# Patient Record
Sex: Female | Born: 1945 | Race: Asian | Hispanic: No | Marital: Married | Smoking: Never smoker
Health system: Southern US, Community
[De-identification: ages and names within clinical notes are randomized; demographics above are authoritative.]

## PROBLEM LIST (undated history)

## (undated) DIAGNOSIS — E7800 Pure hypercholesterolemia, unspecified: Secondary | ICD-10-CM

## (undated) DIAGNOSIS — I10 Essential (primary) hypertension: Secondary | ICD-10-CM

## (undated) DIAGNOSIS — Z8619 Personal history of other infectious and parasitic diseases: Secondary | ICD-10-CM

## (undated) DIAGNOSIS — R634 Abnormal weight loss: Secondary | ICD-10-CM

## (undated) DIAGNOSIS — E119 Type 2 diabetes mellitus without complications: Secondary | ICD-10-CM

## (undated) DIAGNOSIS — I700 Atherosclerosis of aorta: Secondary | ICD-10-CM

## (undated) DIAGNOSIS — M1990 Unspecified osteoarthritis, unspecified site: Secondary | ICD-10-CM

## (undated) HISTORY — DX: Personal history of other infectious and parasitic diseases: Z86.19

## (undated) HISTORY — DX: Abnormal weight loss: R63.4

## (undated) HISTORY — DX: Atherosclerosis of aorta: I70.0

## (undated) HISTORY — DX: Unspecified osteoarthritis, unspecified site: M19.90

## (undated) HISTORY — DX: Type 2 diabetes mellitus without complications: E11.9

## (undated) HISTORY — PX: NO PAST SURGERIES: SHX2092

## (undated) HISTORY — DX: Essential (primary) hypertension: I10

## (undated) HISTORY — DX: Pure hypercholesterolemia, unspecified: E78.00

---

## 2017-11-16 ENCOUNTER — Encounter: Payer: Self-pay | Admitting: Family Medicine

## 2017-11-16 ENCOUNTER — Ambulatory Visit: Payer: Medicare (Managed Care) | Admitting: Family Medicine

## 2017-11-16 ENCOUNTER — Telehealth: Payer: Self-pay | Admitting: Family Medicine

## 2017-11-16 ENCOUNTER — Ambulatory Visit (HOSPITAL_BASED_OUTPATIENT_CLINIC_OR_DEPARTMENT_OTHER)
Admission: RE | Admit: 2017-11-16 | Discharge: 2017-11-16 | Disposition: A | Discharge status: Home / Self Care | Payer: Medicare (Managed Care) | Source: Ambulatory Visit | Attending: Family Medicine | Admitting: Family Medicine

## 2017-11-16 VITALS — BP 110/70 | HR 95 | Temp 98.4°F | Ht 62.0 in | Wt 112.0 lb

## 2017-11-16 DIAGNOSIS — Z0000 Encounter for general adult medical examination without abnormal findings: Secondary | ICD-10-CM | POA: Diagnosis not present

## 2017-11-16 DIAGNOSIS — M533 Sacrococcygeal disorders, not elsewhere classified: Secondary | ICD-10-CM | POA: Insufficient documentation

## 2017-11-16 DIAGNOSIS — R1900 Intra-abdominal and pelvic swelling, mass and lump, unspecified site: Secondary | ICD-10-CM

## 2017-11-16 DIAGNOSIS — I10 Essential (primary) hypertension: Secondary | ICD-10-CM | POA: Diagnosis not present

## 2017-11-16 DIAGNOSIS — E1165 Type 2 diabetes mellitus with hyperglycemia: Secondary | ICD-10-CM | POA: Insufficient documentation

## 2017-11-16 DIAGNOSIS — E118 Type 2 diabetes mellitus with unspecified complications: Secondary | ICD-10-CM | POA: Diagnosis not present

## 2017-11-16 DIAGNOSIS — Z1159 Encounter for screening for other viral diseases: Secondary | ICD-10-CM

## 2017-11-16 DIAGNOSIS — E2839 Other primary ovarian failure: Secondary | ICD-10-CM

## 2017-11-16 DIAGNOSIS — Z23 Encounter for immunization: Secondary | ICD-10-CM | POA: Diagnosis not present

## 2017-11-16 MED ORDER — ATORVASTATIN CALCIUM 10 MG PO TABS: 10.0000 mg | ORAL_TABLET | Freq: Every day | ORAL | 1 refills | Status: DC

## 2017-11-16 MED ORDER — LOSARTAN POTASSIUM 100 MG PO TABS
100.0000 mg | ORAL_TABLET | Freq: Every day | ORAL | 1 refills | Status: DC
Start: 2017-11-16 — End: 2018-06-02

## 2017-11-16 MED ORDER — CHLORTHALIDONE 25 MG PO TABS: 25.0000 mg | ORAL_TABLET | Freq: Every day | ORAL | 1 refills | Status: DC

## 2017-11-16 MED ORDER — METFORMIN HCL 1000 MG PO TABS: 1000.0000 mg | ORAL_TABLET | Freq: Two times a day (BID) | ORAL | 3 refills | Status: DC

## 2017-11-16 MED FILL — LOSARTAN POTASSIUM 100 MG T: 100 | 90 days supply | Qty: 90 | Fill #0

## 2017-11-16 MED FILL — ATORVASTATIN 10 MG TABLET: 10 | 90 days supply | Qty: 90 | Fill #0

## 2017-11-16 MED FILL — CHLORTHALIDONE 25 MG TABS: 25 | 90 days supply | Qty: 90 | Fill #0

## 2017-11-16 MED FILL — metFORMIN HCL 1000 MG TABS: 1000 | 90 days supply | Qty: 180 | Fill #0

## 2017-11-16 NOTE — Progress Notes (Signed)
Chief Complaint  Patient presents with  . New Patient (Initial Visit)    Well Female Dawn Schwartz is here for a complete physical.   Her last physical was >1 year ago.  Current diet: in general, a "healthy" diet.   Current exercise: walking Weight trend: losing Does pt snore? No. Daytime fatigue? No. Seat belt? Yes.    Health maintenance Colonoscopy- Yes 2017; 5 yr follow up after 1 polyp found Tetanus- Yes Hep C- No  Pneumonia vaccine- No Mammogram- 04/2017  Dexa- 2 years ago  Long-standing sacral pain. Uses topical pain patches that are somewhat helpful. No inj or change in activity. No bowel/bladder changes. No neurologic issues.   Past Medical History:  Diagnosis Date  . Diabetes mellitus type 2 in nonobese (HCC)   . Essential hypertension      Past Surgical History:  Procedure Laterality Date  . NO PAST SURGERIES      Medications  Current Outpatient Medications on File Prior to Visit  Medication Sig Dispense Refill  . acarbose (PRECOSE) 100 MG tablet Take 100 mg by mouth 3 (three) times daily with meals.    Marland Kitchen aspirin EC 81 MG tablet Take 81 mg by mouth daily.     Allergies No Known Allergies  Family History Family History  Problem Relation Age of Onset  . Hypertension Mother   . Cancer Neg Hx   . Heart disease Neg Hx     Review of Systems: Constitutional:  no fevers or chills Eye:  no recent significant change in vision Ear/Nose/Mouth/Throat:  Ears:  no recent hearing loss Nose/Mouth/Throat:  no complaints of nasal congestion or sore throat Cardiovascular:  no chest pain, no palpitations Respiratory:  no cough and no shortness of breath Gastrointestinal:  no abdominal pain, no change in bowel habits GU:  Female: negative for dysuria, frequency, and incontinence and negative for prostate symptoms Musculoskeletal/Extremities: +sacral pain; otherwise no pain, redness, or swelling of the joints Integumentary (Skin):  no abnormal skin lesions  reported Neurologic:  no headaches, Endocrine:  +steady weight loss over 20 years; reportedly 10 lb over past year Hematologic/Lymphatic:  no areas of easy bruising  Exam BP 110/70 (BP Location: Left Arm, Patient Position: Sitting, Cuff Size: Normal)   Pulse 95   Temp 98.4 F (36.9 C) (Oral)   Ht 5\' 2"  (1.575 m)   Wt 112 lb (50.8 kg)   SpO2 95%   BMI 20.49 kg/m  General:  well developed, well nourished, in no apparent distress Skin:  no significant moles, warts, or growths Head:  no masses, lesions, or tenderness Eyes:  pupils equal and round, sclera anicteric without injection Ears:  canals without lesions, TMs shiny without retraction, no obvious effusion, no erythema Nose:  nares patent, septum midline, mucosa normal Throat/Pharynx:  lips and gingiva without lesion; tongue and uvula midline; non-inflamed pharynx; no exudates or postnasal drainage Neck: neck supple without adenopathy, thyromegaly, or masses Lungs:  clear to auscultation, breath sounds equal bilaterally, no respiratory distress Cardio:  regular rate and rhythm, no LE edema or bruits Abdomen:  abdomen soft, there is a non-pulsatile mass just to the L of midline over the umbilicus that is ttp; bowel sounds normal Genital: Defer to GYN Rectal: Deferred Musculoskeletal:  symmetrical muscle groups noted without atrophy or deformity Extremities:  no clubbing, cyanosis, or edema, no deformities, no skin discoloration Neuro:  gait normal; deep tendon reflexes normal and symmetric Psych: well oriented with normal range of affect and appropriate judgment/insight  Assessment  and Plan  Well adult exam - Plan: Comprehensive metabolic panel, Lipid panel, Ambulatory referral to Gastroenterology  Controlled type 2 diabetes mellitus with complication, without long-term current use of insulin ([place]) - Plan: CBC, Hemoglobin A1c, Microalbumin / creatinine urine ratio  Essential hypertension  Estrogen deficiency - Plan: DG Bone  Density  Encounter for hepatitis C screening test for low risk patient - Plan: Hepatitis C antibody  Sacral pain - Plan: DG Sacrum/Coccyx  Intra-abdominal and pelvic swelling, mass and lump, unspecified site - Plan: CT Abdomen Pelvis W Contrast  Need for tetanus booster - Plan: Tdap vaccine greater than or equal to 7yo IM  Need for vaccination against Streptococcus pneumoniae - Plan: Pneumococcal conjugate vaccine 13-valent IM   Well 72 y.o. female. Counseled on diet and exercise. Other orders as above. Needs DEXA. Will ck XR. Should also stretch, will consider injections if no improvement vs referral.  Would like to see records regarding DM and chronic conditions care.  Follow up in 6 mo.  The patient voiced understanding and agreement to the plan.  [place], DO 11/16/17 2:56 PM

## 2017-11-16 NOTE — Progress Notes (Signed)
Pre visit review using our clinic review tool, if applicable. No additional management support is needed unless otherwise documented below in the visit note. 

## 2017-11-16 NOTE — Patient Instructions (Signed)
If you do not hear anything about your referral in the next 1-2 weeks, call our office and ask for an update.  2-3 days to get results of your labs back.   Let us know if you need anything.

## 2017-11-16 NOTE — Telephone Encounter (Signed)
While checking the pt out and scheduling her 6 month follow up, pt requested to have her blood work for that appt done the week before, so the results would be on hand.  Pt requested a call to verify if this is approved to schedule appt.

## 2017-11-17 ENCOUNTER — Telehealth: Payer: Self-pay | Admitting: *Deleted

## 2017-11-17 ENCOUNTER — Ambulatory Visit (HOSPITAL_BASED_OUTPATIENT_CLINIC_OR_DEPARTMENT_OTHER)
Admission: RE | Admit: 2017-11-17 | Discharge: 2017-11-17 | Disposition: A | Discharge status: Home / Self Care | Payer: Medicare (Managed Care) | Source: Ambulatory Visit | Attending: Family Medicine | Admitting: Family Medicine

## 2017-11-17 ENCOUNTER — Other Ambulatory Visit (INDEPENDENT_AMBULATORY_CARE_PROVIDER_SITE_OTHER): Payer: Medicare HMO

## 2017-11-17 DIAGNOSIS — E2839 Other primary ovarian failure: Secondary | ICD-10-CM | POA: Insufficient documentation

## 2017-11-17 DIAGNOSIS — E118 Type 2 diabetes mellitus with unspecified complications: Secondary | ICD-10-CM | POA: Diagnosis not present

## 2017-11-17 DIAGNOSIS — Z13820 Encounter for screening for osteoporosis: Secondary | ICD-10-CM | POA: Insufficient documentation

## 2017-11-17 LAB — COMPREHENSIVE METABOLIC PANEL
ALK PHOS: 49 U/L (ref 39–117)
ALT: 28 U/L (ref 0–35)
AST: 17 U/L (ref 0–37)
Albumin: 4.5 g/dL (ref 3.5–5.2)
BUN: 17 mg/dL (ref 6–23)
CALCIUM: 9.3 mg/dL (ref 8.4–10.5)
CO2: 32 meq/L (ref 19–32)
Chloride: 95 mEq/L — ABNORMAL LOW (ref 96–112)
Creatinine, Ser: 0.63 mg/dL (ref 0.40–1.20)
GFR: 99.31 mL/min (ref >60.00)
GLUCOSE: 154 mg/dL — AB (ref 70–99)
POTASSIUM: 4 meq/L (ref 3.5–5.1)
Sodium: 136 mEq/L (ref 135–145)
Total Bilirubin: 0.9 mg/dL (ref 0.2–1.2)
Total Protein: 7 g/dL (ref 6.0–8.3)

## 2017-11-17 LAB — MICROALBUMIN / CREATININE URINE RATIO
CREATININE, U: 89.7 mg/dL
MICROALB/CREAT RATIO: 0.8 mg/g (ref 0.0–30.0)
Microalb, Ur: <0.7 mg/dL (ref 0.0–1.9)

## 2017-11-17 LAB — CBC
HEMATOCRIT: 42.2 % (ref 36.0–46.0)
HEMOGLOBIN: 14.1 g/dL (ref 12.0–15.0)
MCHC: 33.5 g/dL (ref 30.0–36.0)
MCV: 93.4 fl (ref 78.0–100.0)
PLATELETS: 210 10*3/uL (ref 150.0–400.0)
RBC: 4.51 Mil/uL (ref 3.87–5.11)
RDW: 13 % (ref 11.5–15.5)
WBC: 12.9 10*3/uL — ABNORMAL HIGH (ref 4.0–10.5)

## 2017-11-17 LAB — LIPID PANEL
CHOL/HDL RATIO: 3
CHOLESTEROL: 135 mg/dL (ref 0–200)
HDL: 50.8 mg/dL (ref >39.00)
LDL CALC: 60 mg/dL (ref 0–99)
NonHDL: 83.79
TRIGLYCERIDES: 119 mg/dL (ref 0.0–149.0)
VLDL: 23.8 mg/dL (ref 0.0–40.0)

## 2017-11-17 LAB — HEMOGLOBIN A1C: Hgb A1c MFr Bld: 7.1 % — ABNORMAL HIGH (ref 4.6–6.5)

## 2017-11-17 NOTE — Telephone Encounter (Signed)
Patient came in to office this morning with Dawn Schwartz, with her [to translate]. Patient recently moved here from [place] and during the move she lost her Blood Sugar Testing kit. We tried looking at pictures online but to no avail, so I informed them that they could either call the pharmacy in [place] and find out which brand of testing supplies that she was getting and/or call the customer service on the back of her insurance card and ask for the formulary brand that is covered under her plan. Pt's sister-in-law stated that she will call the insurance. Gave them Dr. Irene Limbo business card with phone number to call and my name written on the back. They will get the needed information and relay the glucose monitor name to me via message and I will send to pharmacy [verified] and then call patient's sister-in-law when order has been sent to pharmacy/SLS 07/23

## 2017-11-17 NOTE — Telephone Encounter (Signed)
That's fine. I have placed future orders. Come in around 1 week prior to visit. TY.

## 2017-11-18 ENCOUNTER — Encounter: Payer: Self-pay | Admitting: Gastroenterology

## 2017-11-18 ENCOUNTER — Telehealth: Payer: Self-pay

## 2017-11-18 LAB — HEPATITIS C ANTIBODY
Hepatitis C Ab: NONREACTIVE
SIGNAL TO CUT-OFF: 0.01 (ref <1.00)

## 2017-11-18 MED ORDER — GLUCOSE BLOOD VI STRP: ORAL_STRIP | 3 refills | Status: DC

## 2017-11-18 MED ORDER — TRUEPLUS LANCETS 33G MISC: 6 refills | Status: DC

## 2017-11-18 MED ORDER — TRUE METRIX AIR GLUCOSE METER W/DEVICE KIT: 1.0000 "application " | PACK | Freq: Every day | 0 refills | Status: DC

## 2017-11-18 MED ORDER — BD SWAB SINGLE USE REGULAR PADS: MEDICATED_PAD | 6 refills | Status: DC

## 2017-11-18 NOTE — Addendum Note (Signed)
Addended by: Sharon Seller B on: 11/18/2017 04:53 PM   Modules accepted: Orders

## 2017-11-18 NOTE — Telephone Encounter (Signed)
Also the sister stated she had vomited once today mid morning/having some nausea. She was fine the day before. They would like to know if vaccines given at OV could have done this? Informed to keep the patient well hydrated and will let PCP know of symptoms.

## 2017-11-18 NOTE — Telephone Encounter (Signed)
Called left message to call back 

## 2017-11-18 NOTE — Telephone Encounter (Signed)
Scheduled lab appt. They would like xray results.

## 2017-11-18 NOTE — Telephone Encounter (Signed)
Sent in

## 2017-11-18 NOTE — Telephone Encounter (Signed)
Paper rx requests received for humana true metrix air meter, true metrix test strips, trueplus 33G lancets, BD single use swab, and truemetrix level 1 control solution. Routed to Dr. Nani Ravens to review.

## 2017-11-18 NOTE — Telephone Encounter (Signed)
OK 

## 2017-11-19 NOTE — Telephone Encounter (Signed)
Copy & Pasted from newly opened note/SLS: Conversation  (Newest Message First)  Ewing, Donell Sievert, [place]   11/18/17 4:53 PM  Note    Addended by: Elianne Gubser Seller B on: 11/18/2017 04:53 PM   Modules accepted: Orders      Ewing, Robin B, CMA    11/18/17 4:52 PM  Note    Sent in      11/18/17 4:44 PM  [place], Crosby Oyster, DO routed this conversation to [place], Donell Sievert, [place], [place], DO    11/18/17 4:44 PM  Note    OK.         11/18/17 3:28 PM  Mellen, Riccardo Dubin, RN routed this conversation to Shelda Pal, DO  Mellen, Riccardo Dubin, RN   11/18/17 3:27 PM  Note    Paper rx requests received for humana true metrix air meter, true metrix test strips, trueplus 33G lancets, BD single use swab, and truemetrix level 1 control solution. Routed to Dr. Nani Ravens to review.

## 2017-11-19 NOTE — Telephone Encounter (Signed)
Possibly. Has she had a reaction to vaccines before? Her X-ray does not show anything sinister, we could try physical therapy to help with pain. TY.

## 2017-11-19 NOTE — Telephone Encounter (Signed)
Copy & Pasted to 11/17/17 original note, was awaiting call back/SLS 07/25

## 2017-11-23 ENCOUNTER — Telehealth: Payer: Self-pay | Admitting: Family Medicine

## 2017-11-23 DIAGNOSIS — D72829 Elevated white blood cell count, unspecified: Secondary | ICD-10-CM

## 2017-11-23 NOTE — Telephone Encounter (Signed)
Copied from [place] [id number]. Topic: Quick Communication - Lab Results >> Nov 18, 2017  8:34 AM Ewing, Donell Sievert, CMA wrote: Called patient to inform them of 11/18/2017 lab results. When patient returns call, triage nurse may disclose results. >> Nov 23, 2017 11:47 AM Robina Ade, Helene Kelp D wrote: Patient would like a call back from the [facility] about his wife labs. He would like to talk to pcp CMA about this and also about her referral to have a CT done.

## 2017-11-23 NOTE — Telephone Encounter (Signed)
Spoke to the son/informed of results/scheduled lab appt for 11/24/2017

## 2017-11-23 NOTE — Telephone Encounter (Signed)
The husband would like patients lab results/does not look like is resulted yet

## 2017-11-23 NOTE — Telephone Encounter (Signed)
Called and spoke to the son informed of PCP instructions. The patient is better now.

## 2017-11-23 NOTE — Telephone Encounter (Signed)
White count is a little high. Let's recheck that. Other labs unremarkable. I am OK with where she is at with her diabetes given her age, so no changes there either. Please schedule lab appt, non fasting, order has been placed. TY.

## 2017-11-24 ENCOUNTER — Other Ambulatory Visit (INDEPENDENT_AMBULATORY_CARE_PROVIDER_SITE_OTHER): Payer: Medicare (Managed Care)

## 2017-11-24 DIAGNOSIS — E118 Type 2 diabetes mellitus with unspecified complications: Secondary | ICD-10-CM

## 2017-11-24 LAB — CBC WITH DIFFERENTIAL/PLATELET
BASOS ABS: 0.1 10*3/uL (ref 0.0–0.1)
Basophils Relative: 0.9 % (ref 0.0–3.0)
EOS PCT: 1.9 % (ref 0.0–5.0)
Eosinophils Absolute: 0.2 10*3/uL (ref 0.0–0.7)
HEMATOCRIT: 40.9 % (ref 36.0–46.0)
HEMOGLOBIN: 13.9 g/dL (ref 12.0–15.0)
LYMPHS PCT: 38.5 % (ref 12.0–46.0)
Lymphs Abs: 3 10*3/uL (ref 0.7–4.0)
MCHC: 34 g/dL (ref 30.0–36.0)
MCV: 93.8 fl (ref 78.0–100.0)
MONOS PCT: 7.2 % (ref 3.0–12.0)
Monocytes Absolute: 0.6 10*3/uL (ref 0.1–1.0)
Neutro Abs: 4.1 10*3/uL (ref 1.4–7.7)
Neutrophils Relative %: 51.5 % (ref 43.0–77.0)
Platelets: 292 10*3/uL (ref 150.0–400.0)
RBC: 4.36 Mil/uL (ref 3.87–5.11)
RDW: 13.1 % (ref 11.5–15.5)
WBC: 7.9 10*3/uL (ref 4.0–10.5)

## 2017-12-02 ENCOUNTER — Telehealth: Payer: Self-pay | Admitting: Family Medicine

## 2017-12-02 NOTE — Telephone Encounter (Signed)
Copied from [place] [id number]. Topic: Quick Communication - Office Called Patient >> Dec 01, 2017 11:29 AM Neva Seat wrote: Pt's husband returned missed call.  Please call pt's husband back if needed.  Will call the patient.

## 2018-01-15 ENCOUNTER — Ambulatory Visit (INDEPENDENT_AMBULATORY_CARE_PROVIDER_SITE_OTHER): Payer: Medicare Other | Admitting: Gastroenterology

## 2018-01-15 ENCOUNTER — Telehealth: Payer: Self-pay | Admitting: Family Medicine

## 2018-01-15 ENCOUNTER — Encounter: Payer: Self-pay | Admitting: Gastroenterology

## 2018-01-15 ENCOUNTER — Encounter (INDEPENDENT_AMBULATORY_CARE_PROVIDER_SITE_OTHER): Payer: Self-pay

## 2018-01-15 VITALS — BP 130/76 | HR 100 | Ht 62.0 in | Wt 109.2 lb

## 2018-01-15 DIAGNOSIS — K30 Functional dyspepsia: Secondary | ICD-10-CM | POA: Diagnosis not present

## 2018-01-15 DIAGNOSIS — Z8619 Personal history of other infectious and parasitic diseases: Secondary | ICD-10-CM

## 2018-01-15 DIAGNOSIS — R1013 Epigastric pain: Secondary | ICD-10-CM | POA: Diagnosis not present

## 2018-01-15 NOTE — Telephone Encounter (Signed)
No Auth required- send request to imaging to call & schedule patient for CT

## 2018-01-15 NOTE — Telephone Encounter (Signed)
Copied from [place] [id number]. Topic: Quick Communication - See Telephone Encounter >> Jan 15, 2018 11:43 AM Rosalin Hawking wrote: CRM for notification. See Telephone encounter for: 01/15/18.   Pt would like to be called for her referral for CT scan ASAP. [place] already informed and pt would like to be called at [phone] Bonnita Nasuti (sister- in-law) understands better english. Please advise.

## 2018-01-15 NOTE — Progress Notes (Addendum)
[place] VISIT   Primary Care Provider Shelda Pal, [address] [place] [address] [phone]  Referring Provider Shelda Pal, [address] [place], [place] [address] [phone]  Patient Profile: Dawn Schwartz is a 72 y.o. female with a pmh significant for DM, HTN, HLD, prior HP infection (never checked for eradication per report), GERD.  The patient presents to the [facility] for an evaluation and management of problem(s) noted below:  Problem List 1. Epigastric pain   2. Acid indigestion   3. History of Helicobacter pylori infection     History of Present Illness: This is the patient's first visit to the GI [place] clinic.  She describes a prior history as well as her sister who acts as a Optometrist of having a gastroenterologist when they were living in [place].  She is moved to this area in the last couple of years due to the better weather.  The patient underwent an endoscopy at least 15 years ago.  She describes having been diagnosed with H. pylori.  She describes at the time having symptoms of acid reflux as well as indigestion.  She does not recall the exact medication therapy that she was given however.  She does not recall ever being evaluated for eradication.  She has done well over the course of many years and actually had a screening colonoscopy done while she was living in [place] in the last 5 years (we will work on trying to obtain these records as the patient and family do not have this).  However the patient describes over the course the last year or so while being in this area of having recurrence of her acid reflux and indigestion.  Postprandial discomfort in the left upper quadrant region occurs up to 30 to 45 minutes after eating.  This occurring at least 2 times per week.  She is taking Pepto-Bismol help with this.  She has taken an H2 RA blocker as  well as a PPI in the past but has not been recently taking that.  She is having a daily bowel movement which is soft and brown.  She describes no melena hematochezia.  She has nausea occur infrequently over the course of the day most often in the morning and then this will come and go over the course of the day.  She does not take any medication for this.  She does not have any vomiting, coffee-ground emesis, hematemesis.  She does not take significant amounts of nonsteroidals other than a baby aspirin.    GI Review of Systems Positive as above including at times early satiety (but she is able to finish her entire meal) Negative for dysphasia, odynophagia, jaundice, abdominal pain, change in bowel habits  Review of Systems General: Denies fevers/chills/weight loss HEENT: Denies oral lesions Cardiovascular: Denies chest pain Pulmonary: Denies shortness of breath Gastroenterological: See HPI Genitourinary: Denies darkened urine Hematological: Denies easy bruising/bleeding Dermatological: Positive for some skin irritation Psychological: Mood is stable Allergy & Immunology: Denies severe allergic reactions Musculoskeletal: Back pain that occurred times as well as cramping in her left leg   Medications Current Outpatient Medications  Medication Sig Dispense Refill  . acarbose (PRECOSE) 100 MG tablet Take 100 mg by mouth 3 (three) times daily with meals.    . Alcohol Swabs (B-D SINGLE USE SWABS REGULAR) PADS Use daily to check blood sugar.  DX E11.9 100 each 6  . aspirin EC  81 MG tablet Take 81 mg by mouth daily.    Marland Kitchen atorvastatin (LIPITOR) 10 MG tablet Take 1 tablet (10 mg total) by mouth daily. 90 tablet 1  . Blood Glucose Monitoring Suppl (TRUE METRIX AIR GLUCOSE METER) w/Device KIT Inject 1 application as directed daily. To use daily to check blood sugar.  DX K53Z7 1 kit 0  . chlorthalidone (HYGROTON) 25 MG tablet Take 1 tablet (25 mg total) by mouth daily. 90 tablet 1  . glucose blood  (TRUE METRIX BLOOD GLUCOSE TEST) test strip Use once daily to check blood sugar.  DX E11.9 100 each 3  . losartan (COZAAR) 100 MG tablet Take 1 tablet (100 mg total) by mouth daily. 90 tablet 1  . metFORMIN (GLUCOPHAGE) 1000 MG tablet Take 1 tablet (1,000 mg total) by mouth 2 (two) times daily with a meal. 180 tablet 3  . TRUEPLUS LANCETS 33G MISC Use once daily to check blood sugar.  DX E11.9 100 each 6   No current facility-administered medications for this visit.     Allergies No Known Allergies  Histories Past Medical History:  Diagnosis Date  . Diabetes mellitus type 2 in nonobese (HCC)   . Essential hypertension   . History of Helicobacter pylori infection   . Hypercholesterolemia   . Weight loss    Past Surgical History:  Procedure Laterality Date  . NO PAST SURGERIES     Social History   Socioeconomic History  . Marital status: Married    Spouse name: Not on file  . Number of children: 2  . Years of education: Not on file  . Highest education level: Not on file  Occupational History  . Occupation: retired  Scientific laboratory technician  . Financial resource strain: Not on file  . Food insecurity:    Worry: Not on file    Inability: Not on file  . Transportation needs:    Medical: Not on file    Non-medical: Not on file  Tobacco Use  . Smoking status: Never Smoker  . Smokeless tobacco: Never Used  Substance and Sexual Activity  . Alcohol use: Never    Frequency: Never  . Drug use: Never  . Sexual activity: Not on file  Lifestyle  . Physical activity:    Days per week: Not on file    Minutes per session: Not on file  . Stress: Not on file  Relationships  . Social connections:    Talks on phone: Not on file    Gets together: Not on file    Attends religious service: Not on file    Active member of club or organization: Not on file    Attends meetings of clubs or organizations: Not on file    Relationship status: Not on file  . Intimate partner violence:    Fear of  current or ex partner: Not on file    Emotionally abused: Not on file    Physically abused: Not on file    Forced sexual activity: Not on file  Other Topics Concern  . Not on file  Social History Narrative  . Not on file   Family History  Problem Relation Age of Onset  . Hypertension Mother   . Other Father        old age  . Cancer Neg Hx   . Heart disease Neg Hx   . Colon cancer Neg Hx   . Liver cancer Neg Hx   . Stomach cancer Neg Hx   .  Rectal cancer Neg Hx   . Esophageal cancer Neg Hx    I have reviewed her medical, social, and family history in detail and updated the electronic medical record as necessary.    PHYSICAL EXAMINATION  BP 130/76   Pulse 100   Ht '5\' 2"'$  (1.575 m)   Wt 109 lb 4 oz (49.6 kg)   BMI 19.98 kg/m  Wt Readings from Last 3 Encounters:  01/15/18 109 lb 4 oz (49.6 kg)  11/16/17 112 lb (50.8 kg)  GEN: NAD, appears stated age, doesn't appear chronically ill PSYCH: Cooperative, without pressured speech EYE: Conjunctivae pink, sclerae anicteric ENT: MMM, without oral ulcers, no erythema or exudates noted NECK: Supple CV: RR without R/Gs  RESP: CTAB posteriorly, without wheezing GI: NABS, soft, minimal tenderness to palpation in the midepigastrium, ND, without rebound or guarding, no HSM appreciated MSK/EXT: No lower extremity edema SKIN: No jaundice NEURO:  Alert & Oriented x 3, no focal deficits   REVIEW OF DATA  I reviewed the following data at the time of this encounter:  GI Procedures and Studies  No studies to review we will work on obtaining from outside providers in [place]  Laboratory Studies  Reviewed in epic  Imaging Studies  No relevant studies   ASSESSMENT  Dawn Schwartz is a 72 y.o. female with a pmh significant for DM, HTN, HLD, prior HP infection (never checked for eradication per report), GERD.  The patient is seen today for evaluation and management of:  1. Epigastric pain   2. Acid indigestion   3. History of  Helicobacter pylori infection    Patient is being evaluated for epigastric abdominal discomfort associated with indigestion and acid sensation.  She has a history of prior H. pylori which was treated but never formally followed up with eradication evaluation.  She is clinically well-appearing and having very minimal symptoms.  With that being said due to her [place] descent I think an upper endoscopic evaluation will likely be required in the coming weeks.  We will begin with an H. pylori stool study as she has not required any Pepto-Bismol or PPI therapy or H2 RA therapy in the last few weeks.  If she is positive for H. pylori we will treat her therapy.  If she is negative for H. pylori and we will pursue an upper endoscopic evaluation.  We will hold on changing any other antacid medications for now.  The risks and benefits of endoscopic evaluation were discussed with the patient; these include but are not limited to the risk of perforation, infection, bleeding, missed lesions, lack of diagnosis, severe illness requiring hospitalization, as well as anesthesia and sedation related illnesses.  The patient is agreeable to proceed if necessary.    PLAN  1. Acid indigestion - Hold Antiacid medications including pepto and H2RA and PPI until after stool study has been obtained  2. Epigastric pain - IBC panel; Future - Ferritin; Future - Lipid panel; Future - CBC; Future - Possible EGD based on findings of HP stool study  3. History of Helicobacter pylori infection - Helicobacter pylori special antigen; Future    Orders Placed This Encounter  Procedures  . Helicobacter pylori special antigen  . IBC panel  . Ferritin  . Lipid panel  . CBC    New Prescriptions   No medications on file   Modified Medications   No medications on file    Planned Follow Up: No follow-ups on file.   Justice Britain, MD [facility]  Advanced Endoscopy Office # [phone]

## 2018-01-15 NOTE — Telephone Encounter (Signed)
awesome

## 2018-01-15 NOTE — Patient Instructions (Signed)
Normal BMI (Body Mass Index- based on height and weight) is between 23 and 30. Your BMI today is Body mass index is 19.98 kg/m. Marland Kitchen Please consider follow up  regarding your BMI with your Primary Care Provider.  Thank you for entrusting me with your care and choosing [facility] care.  Dr Rush Landmark

## 2018-01-18 ENCOUNTER — Other Ambulatory Visit (INDEPENDENT_AMBULATORY_CARE_PROVIDER_SITE_OTHER): Payer: Medicare Other

## 2018-01-18 ENCOUNTER — Encounter: Payer: Self-pay | Admitting: Gastroenterology

## 2018-01-18 DIAGNOSIS — K30 Functional dyspepsia: Secondary | ICD-10-CM | POA: Insufficient documentation

## 2018-01-18 DIAGNOSIS — R1013 Epigastric pain: Secondary | ICD-10-CM | POA: Insufficient documentation

## 2018-01-18 DIAGNOSIS — Z8619 Personal history of other infectious and parasitic diseases: Secondary | ICD-10-CM | POA: Diagnosis not present

## 2018-01-18 LAB — CBC
HEMATOCRIT: 39.4 % (ref 36.0–46.0)
Hemoglobin: 13.1 g/dL (ref 12.0–15.0)
MCHC: 33.3 g/dL (ref 30.0–36.0)
MCV: 92.4 fl (ref 78.0–100.0)
Platelets: 281 10*3/uL (ref 150.0–400.0)
RBC: 4.27 Mil/uL (ref 3.87–5.11)
RDW: 12.7 % (ref 11.5–15.5)
WBC: 7.4 10*3/uL (ref 4.0–10.5)

## 2018-01-18 LAB — IBC PANEL
IRON: 64 ug/dL (ref 42–145)
SATURATION RATIOS: 21.5 % (ref 20.0–50.0)
TRANSFERRIN: 213 mg/dL (ref 212.0–360.0)

## 2018-01-18 LAB — LIPID PANEL
CHOLESTEROL: 104 mg/dL (ref 0–200)
HDL: 37.5 mg/dL — ABNORMAL LOW (ref >39.00)
LDL CALC: 52 mg/dL (ref 0–99)
NonHDL: 66.3
Total CHOL/HDL Ratio: 3
Triglycerides: 70 mg/dL (ref 0.0–149.0)
VLDL: 14 mg/dL (ref 0.0–40.0)

## 2018-01-18 LAB — FERRITIN: FERRITIN: 75.4 ng/mL (ref 10.0–291.0)

## 2018-01-19 LAB — HELICOBACTER PYLORI  SPECIAL ANTIGEN
MICRO NUMBER: 91139424
SPECIMEN QUALITY: ADEQUATE

## 2018-01-21 ENCOUNTER — Other Ambulatory Visit (INDEPENDENT_AMBULATORY_CARE_PROVIDER_SITE_OTHER): Payer: Medicare Other

## 2018-01-21 ENCOUNTER — Other Ambulatory Visit: Payer: Self-pay

## 2018-01-21 ENCOUNTER — Ambulatory Visit (INDEPENDENT_AMBULATORY_CARE_PROVIDER_SITE_OTHER): Payer: Medicare Other | Admitting: Family Medicine

## 2018-01-21 ENCOUNTER — Encounter: Payer: Self-pay | Admitting: Family Medicine

## 2018-01-21 VITALS — BP 115/85 | HR 88 | Temp 98.3°F | Ht 62.0 in | Wt 111.4 lb

## 2018-01-21 DIAGNOSIS — E049 Nontoxic goiter, unspecified: Secondary | ICD-10-CM

## 2018-01-21 DIAGNOSIS — Z23 Encounter for immunization: Secondary | ICD-10-CM

## 2018-01-21 DIAGNOSIS — I10 Essential (primary) hypertension: Secondary | ICD-10-CM

## 2018-01-21 LAB — BASIC METABOLIC PANEL
BUN: 15 mg/dL (ref 6–23)
CHLORIDE: 99 meq/L (ref 96–112)
CO2: 31 meq/L (ref 19–32)
CREATININE: 0.54 mg/dL (ref 0.40–1.20)
Calcium: 9.2 mg/dL (ref 8.4–10.5)
GFR: 118.58 mL/min (ref >60.00)
Glucose, Bld: 269 mg/dL — ABNORMAL HIGH (ref 70–99)
POTASSIUM: 3.9 meq/L (ref 3.5–5.1)
Sodium: 138 mEq/L (ref 135–145)

## 2018-01-21 MED ORDER — OMEPRAZOLE 40 MG PO CPDR: 40.0000 mg | DELAYED_RELEASE_CAPSULE | Freq: Every day | ORAL | 3 refills | Status: DC

## 2018-01-21 MED FILL — OMEPRAZOLE 40 MG CPDR: 40 | 90 days supply | Qty: 90 | Fill #0

## 2018-01-21 NOTE — Patient Instructions (Addendum)
Give [place] 2-3 business days to get the results of your labs back.   If you don't hear anything about the ultrasound in the next few business days, call and ask for an update.  Let us know if you need anything.

## 2018-01-21 NOTE — Progress Notes (Signed)
Chief Complaint  Patient presents with  . Neck Pain    c/o lump felt on left side of neck x 10 days.     Dawn Schwartz is a 72 y.o. female here for a skin complaint. Here with family member who helps interpret.   Duration: 10 days Location: L side of neck Pruritic? No Painful? Yes- when she pushes it. Drainage? No  Recent illness? No Other associated symptoms: Denies difficulty swallowing or voice changes Has been losing weight.  ROS:  Const: No fevers Skin: As noted in HPI  Past Medical History:  Diagnosis Date  . Diabetes mellitus type 2 in nonobese (HCC)   . Essential hypertension   . History of Helicobacter pylori infection   . Hypercholesterolemia   . Weight loss    No Known Allergies   BP 115/85 (BP Location: Right Arm, Patient Position: Sitting, Cuff Size: Normal)   Pulse 88   Temp 98.3 F (36.8 C) (Oral)   Ht 5\' 2"  (1.575 m)   Wt 111 lb 6.4 oz (50.5 kg)   SpO2 100%   BMI 20.38 kg/m  Gen: awake, alert, appearing stated age Lungs: No accessory muscle use HEENT: Thyroid is enlarged on L, +TTP over this area as well, difficult for me to discern a discreet nodule or generalized enlargement Heart: RRR, no LE edema Neuro: DTR's equal and symmetric Psych: Age appropriate judgment and insight  Enlarged thyroid - Plan: TSH, T4, free, US Soft Tissue Head/Neck  Immunization due - Plan: Flu vaccine HIGH DOSE PF (Fluzone High dose)  Orders as above. A CT scan I ordered in July was not done. Her family member is very upset about this. She is heading downstairs to the imaging dept to find out more.  She is also upset that I did not get a screening thyroid level with her physical despite my insistence that screening thyroid levels are not routinely done. With requests that lie outside appropriate medical decision making, it may be reasonable in future to allow family member in lobby only and use a Optometrist. F/u pending above. The patient voiced understanding and agreement  to the plan.  [place], DO 01/21/18 2:34 PM

## 2018-01-22 ENCOUNTER — Telehealth: Payer: Self-pay

## 2018-01-22 LAB — TSH: TSH: 0.09 u[IU]/mL — ABNORMAL LOW (ref 0.35–4.50)

## 2018-01-22 LAB — T4, FREE: Free T4: 1.31 ng/dL (ref 0.60–1.60)

## 2018-01-22 NOTE — Telephone Encounter (Signed)
Author phoned pt. To relay thyroid results, spoke with husband, and husband verbalized understanding.

## 2018-01-22 NOTE — Telephone Encounter (Signed)
-----   Message from Shelda Pal, DO sent at 01/22/2018 10:21 AM EDT ----- Let pt know her active thyroid level is normal. No change in plan for now.

## 2018-01-23 ENCOUNTER — Ambulatory Visit (HOSPITAL_BASED_OUTPATIENT_CLINIC_OR_DEPARTMENT_OTHER)
Admission: RE | Admit: 2018-01-23 | Discharge: 2018-01-23 | Disposition: A | Discharge status: Home / Self Care | Payer: Medicare Other | Source: Ambulatory Visit | Attending: Family Medicine | Admitting: Family Medicine

## 2018-01-23 ENCOUNTER — Encounter (HOSPITAL_BASED_OUTPATIENT_CLINIC_OR_DEPARTMENT_OTHER): Payer: Self-pay

## 2018-01-23 DIAGNOSIS — M5136 Other intervertebral disc degeneration, lumbar region: Secondary | ICD-10-CM | POA: Insufficient documentation

## 2018-01-23 DIAGNOSIS — I700 Atherosclerosis of aorta: Secondary | ICD-10-CM | POA: Diagnosis not present

## 2018-01-23 DIAGNOSIS — K279 Peptic ulcer, site unspecified, unspecified as acute or chronic, without hemorrhage or perforation: Secondary | ICD-10-CM | POA: Diagnosis not present

## 2018-01-23 DIAGNOSIS — M47896 Other spondylosis, lumbar region: Secondary | ICD-10-CM | POA: Insufficient documentation

## 2018-01-23 DIAGNOSIS — R1900 Intra-abdominal and pelvic swelling, mass and lump, unspecified site: Secondary | ICD-10-CM | POA: Insufficient documentation

## 2018-01-23 DIAGNOSIS — E049 Nontoxic goiter, unspecified: Secondary | ICD-10-CM | POA: Insufficient documentation

## 2018-01-23 MED ORDER — IOPAMIDOL (ISOVUE-300) INJECTION 61%
100.0000 mL | Freq: Once | INTRAVENOUS | Status: AC | PRN
  Administered 2018-01-23: 100 mL via INTRAVENOUS

## 2018-01-25 ENCOUNTER — Telehealth: Payer: Self-pay | Admitting: Family Medicine

## 2018-01-25 ENCOUNTER — Encounter: Payer: Self-pay | Admitting: Family Medicine

## 2018-01-25 ENCOUNTER — Other Ambulatory Visit: Payer: Self-pay | Admitting: Family Medicine

## 2018-01-25 DIAGNOSIS — R9389 Abnormal findings on diagnostic imaging of other specified body structures: Secondary | ICD-10-CM

## 2018-01-25 DIAGNOSIS — I700 Atherosclerosis of aorta: Secondary | ICD-10-CM | POA: Insufficient documentation

## 2018-01-25 MED ORDER — ATORVASTATIN CALCIUM 40 MG PO TABS: 40.0000 mg | ORAL_TABLET | Freq: Every day | ORAL | 3 refills | Status: DC

## 2018-01-25 MED FILL — ATORVASTATIN 40 MG TABLET: 40 | 90 days supply | Qty: 90 | Fill #0

## 2018-01-25 NOTE — Telephone Encounter (Signed)
Can we use [place] phone interpreter to contact?

## 2018-01-25 NOTE — Telephone Encounter (Signed)
Copied from [place] [id number]. Topic: Quick Communication - Other Results >> Jan 25, 2018  3:13 PM Dawn Schwartz wrote: Pt is needing himself or his brother Dawn Schwartz called(DPR) so that he can contact his sister([name]; pt's sis in law; who speaks english) to explain latest image results b/c pt's english as well as individuals on DPR is not good and they had a hard time understanding the results; contact to advise  I do not see this family members/name/telephone number on her contact DPR

## 2018-01-25 NOTE — Telephone Encounter (Signed)
The patient and her husband came by the office and got a paper copy of results/with instructions//will give to the family member.

## 2018-01-27 ENCOUNTER — Telehealth: Payer: Self-pay | Admitting: Family Medicine

## 2018-01-27 NOTE — Telephone Encounter (Signed)
Copied from [place] [id number]. Topic: Inquiry >> Jan 27, 2018  8:29 AM Oliver Pila B wrote: Reason for CRM: pt's husband called to schedule an U/S for the pt; contact to advise

## 2018-01-27 NOTE — Telephone Encounter (Signed)
Called the husband and gave him imagining telephone number to call and schedule appt.

## 2018-01-30 ENCOUNTER — Ambulatory Visit (HOSPITAL_BASED_OUTPATIENT_CLINIC_OR_DEPARTMENT_OTHER)
Admission: RE | Admit: 2018-01-30 | Discharge: 2018-01-30 | Disposition: A | Discharge status: Home / Self Care | Payer: Medicare Other | Source: Ambulatory Visit | Attending: Family Medicine | Admitting: Family Medicine

## 2018-01-30 DIAGNOSIS — R9389 Abnormal findings on diagnostic imaging of other specified body structures: Secondary | ICD-10-CM

## 2018-01-30 DIAGNOSIS — D259 Leiomyoma of uterus, unspecified: Secondary | ICD-10-CM | POA: Insufficient documentation

## 2018-02-01 ENCOUNTER — Encounter: Payer: Self-pay | Admitting: Family Medicine

## 2018-03-02 MED FILL — metFORMIN HCL 1000 MG TABS: 1000 | 90 days supply | Qty: 180 | Fill #1

## 2018-03-02 MED FILL — CHLORTHALIDONE 25 MG TABS: 25 | 90 days supply | Qty: 90 | Fill #1

## 2018-03-02 MED FILL — LOSARTAN POTASSIUM 100 MG T: 100 | 90 days supply | Qty: 90 | Fill #1

## 2018-04-23 ENCOUNTER — Other Ambulatory Visit: Payer: Self-pay | Admitting: Family Medicine

## 2018-04-23 DIAGNOSIS — E559 Vitamin D deficiency, unspecified: Secondary | ICD-10-CM

## 2018-04-23 MED FILL — OMEPRAZOLE 40 MG CPDR: 40 | 90 days supply | Qty: 90 | Fill #1

## 2018-04-23 NOTE — Progress Notes (Signed)
For 1/15 lab draw, in addition to A1c, CMP, CBC, will add Vit D.

## 2018-04-27 MED FILL — ATORVASTATIN 40 MG TABLET: 40 | 90 days supply | Qty: 90 | Fill #1

## 2018-05-12 ENCOUNTER — Other Ambulatory Visit (INDEPENDENT_AMBULATORY_CARE_PROVIDER_SITE_OTHER): Payer: Medicare Other

## 2018-05-12 DIAGNOSIS — E118 Type 2 diabetes mellitus with unspecified complications: Secondary | ICD-10-CM

## 2018-05-12 DIAGNOSIS — E559 Vitamin D deficiency, unspecified: Secondary | ICD-10-CM | POA: Diagnosis not present

## 2018-05-12 LAB — COMPREHENSIVE METABOLIC PANEL
ALT: 27 U/L (ref 0–35)
AST: 20 U/L (ref 0–37)
Albumin: 4.7 g/dL (ref 3.5–5.2)
Alkaline Phosphatase: 51 U/L (ref 39–117)
BUN: 13 mg/dL (ref 6–23)
CO2: 34 mEq/L — ABNORMAL HIGH (ref 19–32)
Calcium: 9.9 mg/dL (ref 8.4–10.5)
Chloride: 99 mEq/L (ref 96–112)
Creatinine, Ser: 0.65 mg/dL (ref 0.40–1.20)
GFR: 95.66 mL/min (ref >60.00)
GLUCOSE: 104 mg/dL — AB (ref 70–99)
Potassium: 4.1 mEq/L (ref 3.5–5.1)
Sodium: 140 mEq/L (ref 135–145)
Total Bilirubin: 0.7 mg/dL (ref 0.2–1.2)
Total Protein: 7 g/dL (ref 6.0–8.3)

## 2018-05-12 LAB — VITAMIN D 25 HYDROXY (VIT D DEFICIENCY, FRACTURES): VITD: 44.01 ng/mL (ref 30.00–100.00)

## 2018-05-12 LAB — HEMOGLOBIN A1C: Hgb A1c MFr Bld: 7.1 % — ABNORMAL HIGH (ref 4.6–6.5)

## 2018-05-17 ENCOUNTER — Encounter: Payer: Self-pay | Admitting: Family Medicine

## 2018-05-17 ENCOUNTER — Ambulatory Visit (INDEPENDENT_AMBULATORY_CARE_PROVIDER_SITE_OTHER): Payer: Medicare Other | Admitting: Family Medicine

## 2018-05-17 VITALS — BP 112/68 | HR 77 | Temp 97.8°F | Ht 62.0 in | Wt 117.1 lb

## 2018-05-17 DIAGNOSIS — E118 Type 2 diabetes mellitus with unspecified complications: Secondary | ICD-10-CM

## 2018-05-17 NOTE — Progress Notes (Signed)
Subjective:   Chief Complaint  Patient presents with  . Follow-up    Dawn Schwartz is a 73 y.o. female here for follow-up of diabetes.    Dawn Schwartz's self monitored glucose range is low 100's in AM.  Patient denies hypoglycemic reactions. She checks her glucose levels 2 times per week. Patient does not require insulin.   Medications include: metformin 1000 mg bid2 Exercise: housework, walking  Past Medical History:  Diagnosis Date  . Aortic atherosclerosis ([place])   . Diabetes mellitus type 2 in nonobese (HCC)   . Essential hypertension   . History of Helicobacter pylori infection   . Hypercholesterolemia   . Weight loss      Related testing: Date of retinal exam: 1 year ago Pneumovax: done  Review of Systems: Pulmonary:  No SOB Cardiovascular:  No chest pain  Objective:  BP 112/68 (BP Location: Left Arm, Patient Position: Sitting, Cuff Size: Normal)   Pulse 77   Temp 97.8 F (36.6 C) (Oral)   Ht 5\' 2"  (1.575 m)   Wt 117 lb 2 oz (53.1 kg)   SpO2 98%   BMI 21.42 kg/m  General:  Well developed, well nourished, in no apparent distress Skin:  Warm, no pallor or diaphoresis Head:  Normocephalic, atraumatic Eyes:  Pupils equal and round, sclera anicteric without injection  Lungs:  CTAB, no access msc use Cardio:  RRR, no bruits, no LE edema Musculoskeletal:  Symmetrical muscle groups noted without atrophy or deformity Neuro:  Sensation intact to pinprick on feet Psych: Age appropriate judgment and insight  Assessment:   Controlled type 2 diabetes mellitus with complication, without long-term current use of insulin ([place]) - Plan: Ambulatory referral to Ophthalmology, [facility] DIABETES FOOT EXAM   Plan:   Orders as above. Counseled on diet and exercise. I do want her to take calcium and vitamin D for supplementation.  It is okay to take melatonin as needed.  If she feels better taking fish oil, she may continue.  She was instructed that this has not been shown to decrease rates of  heart attack and stroke as has been hoped for. F/u in 6 mo. The patient voiced understanding and agreement to the plan.  Greater than 15 minutes were spent face to face with the patient with greater than 50% of this time spent counseling on diabetes and supplement/vitamins.   [place], DO 05/17/18 12:02 PM

## 2018-05-17 NOTE — Patient Instructions (Addendum)
1200 mg calcium and 1000 units of Vit D daily can be helpful.   OK to take melatonin if you want.  OK to take Fish oil if you want. It has not been shown to decrease heart attacks or strokes (which is why we care about cholesterol).  If you do not hear anything about your referral in the next 1-2 weeks, call our office and ask for an update.   Keep the diet clean and stay active.  For your age, your a1c goal is <8.   Let us know if you need anything.

## 2018-05-17 NOTE — Progress Notes (Signed)
Pre visit review using our clinic review tool, if applicable. No additional management support is needed unless otherwise documented below in the visit note. 

## 2018-05-19 ENCOUNTER — Ambulatory Visit: Payer: Medicare HMO | Admitting: Family Medicine

## 2018-06-02 ENCOUNTER — Other Ambulatory Visit: Payer: Self-pay | Admitting: Family Medicine

## 2018-06-02 MED FILL — CHLORTHALIDONE 25 MG TABS: 25 | 90 days supply | Qty: 90 | Fill #0

## 2018-06-02 MED FILL — LOSARTAN POTASSIUM 100 MG T: 100 | 90 days supply | Qty: 90 | Fill #0

## 2018-06-02 MED FILL — metFORMIN HCL 1000 MG TABS: 1000 | 90 days supply | Qty: 180 | Fill #2

## 2018-07-23 MED FILL — OMEPRAZOLE 40 MG CPDR: 40 | 90 days supply | Qty: 90 | Fill #2

## 2018-07-23 MED FILL — ATORVASTATIN 40 MG TABLET: 40 | 90 days supply | Qty: 90 | Fill #2

## 2018-08-30 MED FILL — LOSARTAN POTASSIUM 100 MG T: 100 | 90 days supply | Qty: 90 | Fill #1

## 2018-08-30 MED FILL — metFORMIN HCL 1000 MG TABS: 1000 | 90 days supply | Qty: 180 | Fill #3

## 2018-08-30 MED FILL — CHLORTHALIDONE 25 MG TABLET: 25 | 90 days supply | Qty: 90 | Fill #1

## 2018-10-26 MED FILL — ATORVASTATIN 40 MG TABLET: 40 | 90 days supply | Qty: 90 | Fill #3

## 2018-10-26 MED FILL — OMEPRAZOLE 40 MG CPDR: 40 | 90 days supply | Qty: 90 | Fill #3

## 2018-11-08 ENCOUNTER — Other Ambulatory Visit: Payer: Self-pay

## 2018-11-08 ENCOUNTER — Ambulatory Visit (INDEPENDENT_AMBULATORY_CARE_PROVIDER_SITE_OTHER): Payer: Medicare Other | Admitting: Family Medicine

## 2018-11-08 ENCOUNTER — Encounter: Payer: Self-pay | Admitting: Family Medicine

## 2018-11-08 VITALS — BP 120/78 | HR 91 | Temp 98.4°F | Ht 60.0 in | Wt 112.5 lb

## 2018-11-08 DIAGNOSIS — W57XXXA Bitten or stung by nonvenomous insect and other nonvenomous arthropods, initial encounter: Secondary | ICD-10-CM

## 2018-11-08 DIAGNOSIS — S20469A Insect bite (nonvenomous) of unspecified back wall of thorax, initial encounter: Secondary | ICD-10-CM | POA: Diagnosis not present

## 2018-11-08 DIAGNOSIS — S30861A Insect bite (nonvenomous) of abdominal wall, initial encounter: Secondary | ICD-10-CM | POA: Diagnosis not present

## 2018-11-08 DIAGNOSIS — S40869A Insect bite (nonvenomous) of unspecified upper arm, initial encounter: Secondary | ICD-10-CM

## 2018-11-08 DIAGNOSIS — S80869A Insect bite (nonvenomous), unspecified lower leg, initial encounter: Secondary | ICD-10-CM

## 2018-11-08 MED ORDER — DOXYCYCLINE HYCLATE 100 MG PO TABS: 200.0000 mg | ORAL_TABLET | Freq: Once | ORAL | 0 refills | Status: AC

## 2018-11-08 MED ORDER — TRIAMCINOLONE ACETONIDE 0.1 % EX CREA: 1.0000 "application " | TOPICAL_CREAM | Freq: Two times a day (BID) | CUTANEOUS | 0 refills | Status: DC

## 2018-11-08 MED FILL — TRIAMCINOLONE 0.1% CREAM: 0.1 | 14 days supply | Qty: 30 | Fill #0

## 2018-11-08 MED FILL — DOXYCYCLINE HYCLATE 100 MG: 100 | 1 days supply | Qty: 2 | Fill #0

## 2018-11-08 NOTE — Progress Notes (Signed)
Chief Complaint  Patient presents with  . Rash    itching  . Insect Bite    Dawn Schwartz is a 73 y.o. female here for a skin complaint.  Duration: 1 week Thinks a tick bit her Location: arms, legs, abd, and upper back Pruritic? Yes Painful? No Drainage? No New soaps/lotions/topicals/detergents? No Sick contacts? No Other associated symptoms: none Therapies tried thus far: ice, topical benadryl  ROS:  Const: No fevers Skin: As noted in HPI  Past Medical History:  Diagnosis Date  . Aortic atherosclerosis ([place])   . Diabetes mellitus type 2 in nonobese (HCC)   . Essential hypertension   . History of Helicobacter pylori infection   . Hypercholesterolemia   . Weight loss     BP 120/78 (BP Location: Left Arm, Patient Position: Sitting, Cuff Size: Normal)   Pulse 91   Temp 98.4 F (36.9 C) (Oral)   Ht 5' (1.524 m)   Wt 112 lb 8 oz (51 kg)   SpO2 97%   BMI 21.97 kg/m  Gen: awake, alert, appearing stated age Lungs: No accessory muscle use Skin: various papules with central excoriation and mild amounts of redness. No drainage, TTP, fluctuance. Web spaces and groin are not affected. Face unaffected. Clustered in linear arrangements.  Psych: Age appropriate judgment and insight  Insect bite, unspecified site, initial encounter - Plan: triamcinolone cream (KENALOG) 0.1 %, doxycycline (VIBRA-TABS) 100 MG tablet, cover for Lyme Dz w 200 mg doxy. Try not to itch, put clothing/bedding thru dryer cycle. Cream for itch. Ice.   F/u for CPE. The patient voiced understanding and agreement to the plan.  [place], DO 11/08/18 1:19 PM

## 2018-11-08 NOTE — Patient Instructions (Signed)
You have a cyst on your finger. It is not dangerous.   Try not to itch.  Ice/cold pack over area for 10-15 min several times daily to help with itch.  Put your sheets through a washer/dryer cycle.  We are calling in an antibiotic to prevent Lyme Disease.   Use the cream twice daily to help with the itch.  Take a daily antihistamine like:  Claritin (loratadine), Allegra (fexofenadine), Zyrtec (cetirizine) which is also equivalent to Xyzal (levocetirizine); these are listed in order from weakest to strongest. Generic, and therefore cheaper, options are in the parentheses.   There are available OTC, and the generic versions, which may be cheaper, are in parentheses. Show this to a pharmacist if you have trouble finding any of these items.  Let us know if you need anything.

## 2018-11-12 ENCOUNTER — Ambulatory Visit (INDEPENDENT_AMBULATORY_CARE_PROVIDER_SITE_OTHER): Payer: Medicare Other | Admitting: Family Medicine

## 2018-11-12 ENCOUNTER — Encounter: Payer: Self-pay | Admitting: Family Medicine

## 2018-11-12 ENCOUNTER — Other Ambulatory Visit: Payer: Self-pay

## 2018-11-12 VITALS — BP 110/82 | HR 102 | Temp 98.7°F | Wt 113.1 lb

## 2018-11-12 DIAGNOSIS — L282 Other prurigo: Secondary | ICD-10-CM

## 2018-11-12 MED ORDER — LEVOCETIRIZINE DIHYDROCHLORIDE 5 MG PO TABS: 5.0000 mg | ORAL_TABLET | Freq: Every evening | ORAL | 1 refills | Status: DC

## 2018-11-12 MED ORDER — PREDNISONE 20 MG PO TABS: 40.0000 mg | ORAL_TABLET | Freq: Every day | ORAL | 0 refills | Status: AC

## 2018-11-12 MED FILL — LEVOCETIRIZINE 5 MG TABLET: 5 | 30 days supply | Qty: 30 | Fill #0

## 2018-11-12 MED FILL — predniSONE 20 MG TABS: 20 | 5 days supply | Qty: 10 | Fill #0

## 2018-11-12 NOTE — Patient Instructions (Addendum)
Cancel appointment if you are not better.   Try not to itch.  Ice/cold can be helpful.  Keep the skin moisturized.   This looks like bug bites to me because of the scab in the middle of the red bumps. If you get lesions in between your fingers or toes, please let me know.   This does not look like poison ivy.   Let us know if you need anything.

## 2018-11-12 NOTE — Progress Notes (Signed)
Chief Complaint  Patient presents with  . Poison Ivy    Dawn Schwartz is a 73 y.o. female here for f/u skin complaint. Here w husband.  Seen on 7/13 for skin complaint. Told us she had a tick bite, was rx'd a single dose of doxy and topical steroid. Neither was helpful. Still having itching. No pain or drainage. Getting worse.  ROS:  Const: No fevers Skin: As noted in HPI  Past Medical History:  Diagnosis Date  . Aortic atherosclerosis ([place])   . Diabetes mellitus type 2 in nonobese (HCC)   . Essential hypertension   . History of Helicobacter pylori infection   . Hypercholesterolemia   . Weight loss     BP 110/82 (BP Location: Left Arm, Patient Position: Sitting, Cuff Size: Normal)   Pulse (!) 102   Temp 98.7 F (37.1 C) (Oral)   Wt 113 lb 2 oz (51.3 kg)   SpO2 97%   BMI 22.09 kg/m  Gen: awake, alert, appearing stated age Lungs: No accessory muscle use Skin: erythematous paples w central excoriations. Some patches of confluent erythema.. No drainage, excessive warmth, TTP, fluctuance Psych: Age appropriate judgment and insight  Pruritic rash - Plan: predniSONE (DELTASONE) 20 MG tablet, levocetirizine (XYZAL) 5 MG tablet, pred burst + PO antihistamine, don't itch, cold. Return in 5 d if no better. Will biopsy. No web space involvement, doubt scabies at this time.  The patient voiced understanding and agreement to the plan.  [place], DO 11/12/18 2:44 PM

## 2018-11-15 ENCOUNTER — Telehealth: Payer: Self-pay | Admitting: Family Medicine

## 2018-11-15 ENCOUNTER — Other Ambulatory Visit: Payer: Self-pay | Admitting: Family Medicine

## 2018-11-15 DIAGNOSIS — W57XXXA Bitten or stung by nonvenomous insect and other nonvenomous arthropods, initial encounter: Secondary | ICD-10-CM

## 2018-11-15 MED ORDER — TRIAMCINOLONE ACETONIDE 0.1 % EX CREA: 1.0000 "application " | TOPICAL_CREAM | Freq: Two times a day (BID) | CUTANEOUS | 0 refills | Status: DC

## 2018-11-15 MED ORDER — TRIAMCINOLONE ACETONIDE 0.1 % EX CREA: 1.0000 "application " | TOPICAL_CREAM | Freq: Two times a day (BID) | CUTANEOUS | 3 refills | Status: DC

## 2018-11-15 MED FILL — TRIAMCINOLONE ACETONIDE 0.1: 0.1 | 14 days supply | Qty: 80 | Fill #0

## 2018-11-15 NOTE — Addendum Note (Signed)
Addended by: Sharon Seller B on: 11/15/2018 11:02 AM   Modules accepted: Orders

## 2018-11-15 NOTE — Telephone Encounter (Signed)
Patient requesting larger quantity of medication cream to be filled at the pharmacy at [facility] Requesting 80Gram tube because the rash is all over her body.

## 2018-11-15 NOTE — Telephone Encounter (Signed)
That's fine

## 2018-11-17 ENCOUNTER — Ambulatory Visit: Payer: Medicare Other | Admitting: Family Medicine

## 2018-11-22 ENCOUNTER — Other Ambulatory Visit: Payer: Self-pay | Admitting: Family Medicine

## 2018-11-22 MED ORDER — CHLORTHALIDONE 25 MG PO TABS: 25.0000 mg | ORAL_TABLET | Freq: Every day | ORAL | 1 refills | Status: DC

## 2018-11-22 MED ORDER — LOSARTAN POTASSIUM 100 MG PO TABS: 100.0000 mg | ORAL_TABLET | Freq: Every day | ORAL | 1 refills | Status: DC

## 2018-11-22 MED FILL — LOSARTAN POTASSIUM 100 MG T: 100 | 90 days supply | Qty: 90 | Fill #0

## 2018-11-22 MED FILL — CHLORTHALIDONE 25 MG TABLET: 25 | 90 days supply | Qty: 90 | Fill #0

## 2018-11-24 ENCOUNTER — Other Ambulatory Visit: Payer: Self-pay | Admitting: Family Medicine

## 2018-11-24 ENCOUNTER — Ambulatory Visit: Payer: Medicare Other | Admitting: Family Medicine

## 2018-11-24 MED FILL — metFORMIN HCL 1000 MG TABS: 1000 | 90 days supply | Qty: 180 | Fill #0

## 2018-12-03 ENCOUNTER — Other Ambulatory Visit: Payer: Self-pay

## 2018-12-03 ENCOUNTER — Encounter: Payer: Self-pay | Admitting: Family Medicine

## 2018-12-03 ENCOUNTER — Ambulatory Visit (INDEPENDENT_AMBULATORY_CARE_PROVIDER_SITE_OTHER): Payer: Medicare Other | Admitting: Family Medicine

## 2018-12-03 VITALS — BP 112/68 | HR 85 | Temp 98.3°F | Ht 62.0 in | Wt 114.2 lb

## 2018-12-03 DIAGNOSIS — Z0000 Encounter for general adult medical examination without abnormal findings: Secondary | ICD-10-CM

## 2018-12-03 DIAGNOSIS — Z23 Encounter for immunization: Secondary | ICD-10-CM | POA: Diagnosis not present

## 2018-12-03 DIAGNOSIS — L282 Other prurigo: Secondary | ICD-10-CM | POA: Diagnosis not present

## 2018-12-03 DIAGNOSIS — Z1239 Encounter for other screening for malignant neoplasm of breast: Secondary | ICD-10-CM | POA: Diagnosis not present

## 2018-12-03 DIAGNOSIS — E118 Type 2 diabetes mellitus with unspecified complications: Secondary | ICD-10-CM

## 2018-12-03 MED ORDER — LEVOCETIRIZINE DIHYDROCHLORIDE 5 MG PO TABS: 5.0000 mg | ORAL_TABLET | Freq: Every evening | ORAL | 1 refills | Status: DC

## 2018-12-03 NOTE — Patient Instructions (Addendum)
Give [place] 2-3 business days to get the results of your labs back.   Keep the diet clean and stay active.  Consider scheduling your mammogram the same day as your lab work/urine test.   If the itching does not get better or is not satisfactory, let me know and we will get you set up with an allergy specialist.   Stay hydrated. If you are drinking more water and labs are normal, consider a spoonful of pickle juice before bed.   Let us know if you need anything.

## 2018-12-03 NOTE — Progress Notes (Signed)
Chief Complaint  Patient presents with  . Annual Exam    itching a little better     Well Woman Dawn Schwartz is here for a complete physical.   Her last physical was >1 year ago.  Current diet: in general, a "healthy" diet. Current exercise: active at home. Weight is stable and she denies daytime fatigue. No LMP recorded. Seatbelt? Yes  Health Maintenance Colonoscopy- Yes DEXA- Yes Mammogram- Yes Tetanus- Yes Pneumonia- due Hep C screen- Yes  Past Medical History:  Diagnosis Date  . Aortic atherosclerosis ([place])   . Diabetes mellitus type 2 in nonobese (HCC)   . Essential hypertension   . History of Helicobacter pylori infection   . Hypercholesterolemia   . Weight loss      Past Surgical History:  Procedure Laterality Date  . NO PAST SURGERIES      Medications  Current Outpatient Medications on File Prior to Visit  Medication Sig Dispense Refill  . Alcohol Swabs (B-D SINGLE USE SWABS REGULAR) PADS Use daily to check blood sugar.  DX E11.9 100 each 6  . aspirin EC 81 MG tablet Take 81 mg by mouth daily.    Marland Kitchen atorvastatin (LIPITOR) 40 MG tablet Take 1 tablet (40 mg total) by mouth daily. 90 tablet 3  . Blood Glucose Monitoring Suppl (TRUE METRIX AIR GLUCOSE METER) w/Device KIT Inject 1 application as directed daily. To use daily to check blood sugar.  DX S56C1 1 kit 0  . chlorthalidone (HYGROTON) 25 MG tablet Take 1 tablet (25 mg total) by mouth daily. 90 tablet 1  . glucose blood (TRUE METRIX BLOOD GLUCOSE TEST) test strip Use once daily to check blood sugar.  DX E11.9 100 each 3  . levocetirizine (XYZAL) 5 MG tablet Take 1 tablet (5 mg total) by mouth every evening. 30 tablet 1  . losartan (COZAAR) 100 MG tablet Take 1 tablet (100 mg total) by mouth daily. 90 tablet 1  . metFORMIN (GLUCOPHAGE) 1000 MG tablet TAKE 1 TABLET (1,000 MG TOTAL) BY MOUTH 2 (TWO) TIMES DAILY WITH A MEAL. 180 tablet 3  . omeprazole (PRILOSEC) 40 MG capsule Take 1 capsule (40 mg total) by mouth  daily. 90 capsule 3  . triamcinolone cream (KENALOG) 0.1 % Apply 1 application topically 2 (two) times daily. 30 g 3  . triamcinolone cream (KENALOG) 0.1 % Apply 1 application topically 2 (two) times daily. 80 g 0  . TRUEPLUS LANCETS 33G MISC Use once daily to check blood sugar.  DX E11.9 100 each 6    Allergies No Known Allergies  Review of Systems: Constitutional:  no sweats Eye:  no recent significant change in vision Ear/Nose/Mouth/Throat:  Ears:  No changes in hearing Nose/Mouth/Throat:  no complaints of nasal congestion, no sore throat Cardiovascular: no chest pain Respiratory:  No cough and no shortness of breath Gastrointestinal:  no abdominal pain, no change in bowel habits GU:  Female: negative for dysuria or pelvic pain Musculoskeletal/Extremities:  no pain of the joints Integumentary (Skin/Breast): +itchy skin; otherwise no abnormal skin lesions reported Neurologic:  no headaches Psychiatric:  no anxiety, no depression Endocrine:  denies unexplained weight changes Hematologic/Lymphatic:  no abnormal bleeding  Exam BP 112/68 (BP Location: Left Arm, Patient Position: Sitting, Cuff Size: Normal)   Pulse 85   Temp 98.3 F (36.8 C) (Oral)   Ht 5' 2"  (1.575 m)   Wt 114 lb 4 oz (51.8 kg)   SpO2 98%   BMI 20.90 kg/m  General:  well  developed, well nourished, in no apparent distress Skin: scattered excoriations from itching; otherwise no significant moles, warts, or growths Head:  no masses, lesions, or tenderness Eyes:  pupils equal and round, sclera anicteric without injection Ears:  canals without lesions, TMs shiny without retraction, no obvious effusion, no erythema Nose:  nares patent, septum midline, mucosa normal, and no drainage or sinus tenderness Throat/Pharynx:  lips and gingiva without lesion; tongue and uvula midline; non-inflamed pharynx; no exudates or postnasal drainage Neck: neck supple without adenopathy, thyromegaly, or masses Lungs:  clear to  auscultation, breath sounds equal bilaterally, no respiratory distress Cardio:  regular rate and rhythm, no bruits or LE edema Abdomen:  abdomen soft, nontender; bowel sounds normal; no masses or organomegaly Genital: Deferred Musculoskeletal:  symmetrical muscle groups noted without atrophy or deformity Extremities:  no clubbing, cyanosis, or edema, no deformities, no skin discoloration Neuro:  gait normal; deep tendon reflexes normal and symmetric Psych: well oriented with normal range of affect and appropriate judgment/insight  Assessment and Plan  Well adult exam - Plan: CBC, Comprehensive metabolic panel, Lipid panel  Controlled type 2 diabetes mellitus with complication, without long-term current use of insulin (HCC) - Plan: Hemoglobin A1c, Microalbumin / creatinine urine ratio, Ambulatory referral to Ophthalmology  Pruritic rash - Plan: levocetirizine (XYZAL) 5 MG tablet  Screening for breast cancer - Plan: MM DIGITAL SCREENING BILATERAL   Well 73 y.o. female. Counseled on diet and exercise. If itching not better, will refer to allergist.  Other orders as above. Follow up in 6 mo pending the above workup. The patient voiced understanding and agreement to the plan.  [place], DO 12/03/18 10:54 AM

## 2018-12-03 NOTE — Addendum Note (Signed)
Addended by: Sharon Seller B on: 12/03/2018 11:02 AM   Modules accepted: Orders

## 2018-12-08 MED FILL — LEVOCETIRIZINE 5 MG TABLET: 5 | 90 days supply | Qty: 90 | Fill #0

## 2018-12-13 ENCOUNTER — Other Ambulatory Visit: Payer: Self-pay

## 2018-12-13 ENCOUNTER — Ambulatory Visit (HOSPITAL_BASED_OUTPATIENT_CLINIC_OR_DEPARTMENT_OTHER)
Admission: RE | Admit: 2018-12-13 | Discharge: 2018-12-13 | Disposition: A | Discharge status: Home / Self Care | Payer: Medicare Other | Source: Ambulatory Visit | Attending: Family Medicine | Admitting: Family Medicine

## 2018-12-13 ENCOUNTER — Encounter (HOSPITAL_BASED_OUTPATIENT_CLINIC_OR_DEPARTMENT_OTHER): Payer: Self-pay

## 2018-12-13 DIAGNOSIS — Z1231 Encounter for screening mammogram for malignant neoplasm of breast: Secondary | ICD-10-CM | POA: Diagnosis not present

## 2018-12-13 DIAGNOSIS — Z1239 Encounter for other screening for malignant neoplasm of breast: Secondary | ICD-10-CM | POA: Diagnosis present

## 2018-12-15 ENCOUNTER — Encounter: Payer: Self-pay | Admitting: Family Medicine

## 2018-12-15 ENCOUNTER — Other Ambulatory Visit: Payer: Self-pay

## 2018-12-15 ENCOUNTER — Other Ambulatory Visit (INDEPENDENT_AMBULATORY_CARE_PROVIDER_SITE_OTHER): Payer: Medicare Other

## 2018-12-15 DIAGNOSIS — Z0000 Encounter for general adult medical examination without abnormal findings: Secondary | ICD-10-CM | POA: Diagnosis not present

## 2018-12-15 DIAGNOSIS — E118 Type 2 diabetes mellitus with unspecified complications: Secondary | ICD-10-CM

## 2018-12-15 LAB — COMPREHENSIVE METABOLIC PANEL
ALT: 20 U/L (ref 0–35)
AST: 17 U/L (ref 0–37)
Albumin: 4.7 g/dL (ref 3.5–5.2)
Alkaline Phosphatase: 50 U/L (ref 39–117)
BUN: 16 mg/dL (ref 6–23)
CO2: 31 mEq/L (ref 19–32)
Calcium: 9.8 mg/dL (ref 8.4–10.5)
Chloride: 99 mEq/L (ref 96–112)
Creatinine, Ser: 0.58 mg/dL (ref 0.40–1.20)
GFR: 102.47 mL/min (ref >60.00)
Glucose, Bld: 116 mg/dL — ABNORMAL HIGH (ref 70–99)
Potassium: 4.1 mEq/L (ref 3.5–5.1)
Sodium: 139 mEq/L (ref 135–145)
Total Bilirubin: 0.6 mg/dL (ref 0.2–1.2)
Total Protein: 6.8 g/dL (ref 6.0–8.3)

## 2018-12-15 LAB — CBC
HCT: 41.1 % (ref 36.0–46.0)
Hemoglobin: 13.9 g/dL (ref 12.0–15.0)
MCHC: 33.7 g/dL (ref 30.0–36.0)
MCV: 94.2 fl (ref 78.0–100.0)
Platelets: 224 10*3/uL (ref 150.0–400.0)
RBC: 4.37 Mil/uL (ref 3.87–5.11)
RDW: 13.3 % (ref 11.5–15.5)
WBC: 6.6 10*3/uL (ref 4.0–10.5)

## 2018-12-15 LAB — LIPID PANEL
Cholesterol: 128 mg/dL (ref 0–200)
HDL: 45.5 mg/dL (ref >39.00)
LDL Cholesterol: 59 mg/dL (ref 0–99)
NonHDL: 82.79
Total CHOL/HDL Ratio: 3
Triglycerides: 119 mg/dL (ref 0.0–149.0)
VLDL: 23.8 mg/dL (ref 0.0–40.0)

## 2018-12-15 LAB — MICROALBUMIN / CREATININE URINE RATIO
Creatinine,U: 94.5 mg/dL
Microalb Creat Ratio: 0.7 mg/g (ref 0.0–30.0)
Microalb, Ur: <0.7 mg/dL (ref 0.0–1.9)

## 2018-12-15 LAB — HEMOGLOBIN A1C: Hgb A1c MFr Bld: 7.3 % — ABNORMAL HIGH (ref 4.6–6.5)

## 2019-01-24 ENCOUNTER — Other Ambulatory Visit: Payer: Self-pay | Admitting: Family Medicine

## 2019-01-24 ENCOUNTER — Other Ambulatory Visit: Payer: Self-pay | Admitting: Gastroenterology

## 2019-01-24 MED FILL — ATORVASTATIN 40 MG TABLET: 40 | 90 days supply | Qty: 90 | Fill #0

## 2019-02-21 MED FILL — metFORMIN HCL 1000 MG TABS: 1000 | 90 days supply | Qty: 180 | Fill #1

## 2019-02-21 MED FILL — CHLORTHALIDONE 25 MG TABS: 25 | 90 days supply | Qty: 90 | Fill #1

## 2019-02-21 MED FILL — LOSARTAN POTASSIUM 100 MG T: 100 | 90 days supply | Qty: 90 | Fill #1

## 2019-03-16 ENCOUNTER — Ambulatory Visit (INDEPENDENT_AMBULATORY_CARE_PROVIDER_SITE_OTHER): Payer: Medicare Other

## 2019-03-16 ENCOUNTER — Other Ambulatory Visit: Payer: Self-pay

## 2019-03-16 ENCOUNTER — Ambulatory Visit: Payer: Medicare Other

## 2019-03-16 DIAGNOSIS — Z23 Encounter for immunization: Secondary | ICD-10-CM

## 2019-03-21 MED FILL — LEVOCETIRIZINE 5 MG TABLET: 5 | 90 days supply | Qty: 90 | Fill #1

## 2019-04-18 MED FILL — ATORVASTATIN 40 MG TABLET: 40 | 90 days supply | Qty: 90 | Fill #1

## 2019-04-26 MED FILL — ATORVASTATIN 40 MG TABLET: 40 | 90 days supply | Qty: 90 | Fill #1

## 2019-05-24 ENCOUNTER — Other Ambulatory Visit: Payer: Self-pay | Admitting: Family Medicine

## 2019-05-24 MED FILL — LOSARTAN POTASSIUM 100 MG T: 100 | 90 days supply | Qty: 90 | Fill #0

## 2019-05-24 MED FILL — CHLORTHALIDONE 25 MG TABS: 25 | 90 days supply | Qty: 90 | Fill #0

## 2019-05-24 MED FILL — metFORMIN HCL 1000 MG TABS: 1000 | 90 days supply | Qty: 180 | Fill #2

## 2019-05-30 ENCOUNTER — Other Ambulatory Visit (INDEPENDENT_AMBULATORY_CARE_PROVIDER_SITE_OTHER): Payer: Medicare Other

## 2019-05-30 ENCOUNTER — Other Ambulatory Visit: Payer: Self-pay

## 2019-05-30 ENCOUNTER — Other Ambulatory Visit: Payer: Self-pay | Admitting: Family Medicine

## 2019-05-30 DIAGNOSIS — I10 Essential (primary) hypertension: Secondary | ICD-10-CM

## 2019-05-30 DIAGNOSIS — E118 Type 2 diabetes mellitus with unspecified complications: Secondary | ICD-10-CM | POA: Diagnosis not present

## 2019-05-30 LAB — LIPID PANEL
Cholesterol: 96 mg/dL (ref 0–200)
HDL: 39.1 mg/dL (ref >39.00)
LDL Cholesterol: 41 mg/dL (ref 0–99)
NonHDL: 56.47
Total CHOL/HDL Ratio: 2
Triglycerides: 79 mg/dL (ref 0.0–149.0)
VLDL: 15.8 mg/dL (ref 0.0–40.0)

## 2019-05-30 LAB — COMPREHENSIVE METABOLIC PANEL
ALT: 17 U/L (ref 0–35)
AST: 17 U/L (ref 0–37)
Albumin: 4.6 g/dL (ref 3.5–5.2)
Alkaline Phosphatase: 48 U/L (ref 39–117)
BUN: 15 mg/dL (ref 6–23)
CO2: 31 mEq/L (ref 19–32)
Calcium: 9.6 mg/dL (ref 8.4–10.5)
Chloride: 101 mEq/L (ref 96–112)
Creatinine, Ser: 0.58 mg/dL (ref 0.40–1.20)
GFR: 102.34 mL/min (ref >60.00)
Glucose, Bld: 103 mg/dL — ABNORMAL HIGH (ref 70–99)
Potassium: 3.8 mEq/L (ref 3.5–5.1)
Sodium: 140 mEq/L (ref 135–145)
Total Bilirubin: 0.8 mg/dL (ref 0.2–1.2)
Total Protein: 6.8 g/dL (ref 6.0–8.3)

## 2019-05-30 LAB — HEMOGLOBIN A1C: Hgb A1c MFr Bld: 7 % — ABNORMAL HIGH (ref 4.6–6.5)

## 2019-06-06 ENCOUNTER — Ambulatory Visit: Payer: Medicare Other | Attending: Internal Medicine

## 2019-06-06 ENCOUNTER — Ambulatory Visit: Payer: Medicare Other | Admitting: Family Medicine

## 2019-06-06 DIAGNOSIS — Z23 Encounter for immunization: Secondary | ICD-10-CM

## 2019-06-06 NOTE — Progress Notes (Signed)
   Covid-19 Vaccination Clinic  Name:  Dawn Schwartz    MRN: [id number] DOB: Mar 31, 1946  06/06/2019  [name] was observed post Covid-19 immunization for 15 minutes without incidence. She was provided with Vaccine Information Sheet and instruction to access the V-Safe system.   [name] was instructed to call 911 with any severe reactions post vaccine: Marland Kitchen Difficulty breathing  . Swelling of your face and throat  . A fast heartbeat  . A bad rash all over your body  . Dizziness and weakness    Immunizations Administered    Name Date Dose VIS Date Route   Pfizer COVID-19 Vaccine 06/06/2019  4:02 PM 0.3 mL 04/08/2019 Intramuscular   Manufacturer: [place]   Lot: [id number]   [place]: [id number]

## 2019-06-14 ENCOUNTER — Other Ambulatory Visit: Payer: Self-pay

## 2019-06-15 ENCOUNTER — Other Ambulatory Visit: Payer: Self-pay | Admitting: Family Medicine

## 2019-06-15 ENCOUNTER — Ambulatory Visit (INDEPENDENT_AMBULATORY_CARE_PROVIDER_SITE_OTHER): Payer: Medicare Other | Admitting: Family Medicine

## 2019-06-15 ENCOUNTER — Encounter: Payer: Self-pay | Admitting: Family Medicine

## 2019-06-15 VITALS — BP 120/80 | HR 75 | Temp 95.1°F | Ht 62.0 in | Wt 113.0 lb

## 2019-06-15 DIAGNOSIS — I10 Essential (primary) hypertension: Secondary | ICD-10-CM | POA: Diagnosis not present

## 2019-06-15 DIAGNOSIS — M67441 Ganglion, right hand: Secondary | ICD-10-CM | POA: Diagnosis not present

## 2019-06-15 DIAGNOSIS — E1165 Type 2 diabetes mellitus with hyperglycemia: Secondary | ICD-10-CM | POA: Diagnosis not present

## 2019-06-15 DIAGNOSIS — B351 Tinea unguium: Secondary | ICD-10-CM

## 2019-06-15 MED ORDER — TRUE METRIX BLOOD GLUCOSE TEST VI STRP: ORAL_STRIP | 3 refills | Status: DC

## 2019-06-15 MED ORDER — ONETOUCH ULTRA 2 W/DEVICE KIT: PACK | 0 refills | Status: DC

## 2019-06-15 MED ORDER — ONETOUCH ULTRA VI STRP: ORAL_STRIP | 3 refills | Status: DC

## 2019-06-15 MED ORDER — ONETOUCH ULTRASOFT LANCETS MISC: 3 refills | Status: DC

## 2019-06-15 MED FILL — ONE TOUCH ULTRA TEST STRIPS: 90 days supply | Qty: 100 | Fill #0

## 2019-06-15 MED FILL — ONE TOUCH ULTRASOFT LANCETS: 90 days supply | Qty: 100 | Fill #0

## 2019-06-15 MED FILL — ONETOUCH ULTRA 2 W/DEVICE K: W/DEVICE | 1 days supply | Qty: 1 | Fill #0

## 2019-06-15 NOTE — Patient Instructions (Signed)
Keep the diet clean and stay active.  Because your blood pressure is well-controlled, you no longer have to check your blood pressure at home anymore unless you wish. Some people check it twice daily every day and some people stop altogether. Either or anything in between is fine. Strong work!  You can stop checking your sugars at home too.  Use Vick's Vaporub or Penlac over the counter on your nails daily. This can take 9-15 months to work.   Take 1200 mg of calcium daily and at least 1000 units of Vit D daily. This can be bought over the counter in the vitamin section of most stores.   Let us know if you need anything.

## 2019-06-15 NOTE — Addendum Note (Signed)
Addended by: Sharon Seller B on: 06/15/2019 01:16 PM   Modules accepted: Orders

## 2019-06-15 NOTE — Progress Notes (Signed)
Subjective:   Chief Complaint  Patient presents with  . Follow-up    Dawn Schwartz is a 74 y.o. female here for follow-up of diabetes.  She is here with her husband who helps interpret. Dawn Schwartz's self monitored glucose range is low 100's.  Patient denies hypoglycemic reactions. She checks her glucose levels intermittently. Patient does not require insulin.   Medications include: metformin 1000 mg bid Exercise: walking Diet is healthy.   Hypertension Patient presents for hypertension follow up. She does monitor home blood pressures. Blood pressures ranging on average from 120's/70's. She is compliant with medication- losartan 100 mg/d, chlorthalidone 25 mg/d Patient has these side effects of medication: none Diet and exercise as above.   She has a bump on her right knuckle on the ring finger.  It does not hurt and is not growing.  Feels gelatinous.  She thinks it is more ugly than anything.  Patient has fungus in her toe, the right great toe.  She has mild pain, no drainage or redness.  She tried an oral medicine in the past.  Her symptoms had returned since stopping.  Past Medical History:  Diagnosis Date  . Aortic atherosclerosis ([place])   . Diabetes mellitus type 2 in nonobese (HCC)   . Essential hypertension   . History of Helicobacter pylori infection   . Hypercholesterolemia   . Weight loss      Related testing: Date of retinal exam: ordered, has not had yet Pneumovax: done Flu Shot: done  Review of Systems: Pulmonary:  No SOB Cardiovascular:  No chest pain  Objective:  BP 120/80 (BP Location: Left Arm, Patient Position: Sitting, Cuff Size: Normal)   Pulse 75   Temp (!) 95.1 F (35.1 C) (Temporal)   Ht 5\' 2"  (1.575 m)   Wt 113 lb (51.3 kg)   SpO2 97%   BMI 20.67 kg/m  General:  Well developed, well nourished, in no apparent distress Skin:  Warm, no pallor or diaphoresis; there is a ganglion cyst on the right fourth PIP dorsally; onycholysis and thickening of the  right great toenail Head:  Normocephalic, atraumatic Eyes:  Pupils equal and round, sclera anicteric without injection  Lungs:  CTAB, no access msc use Cardio:  RRR, no bruits, no LE edema Musculoskeletal:  Symmetrical muscle groups noted without atrophy or deformity Neuro:  Sensation intact to pinprick on feet Psych: Age appropriate judgment and insight  Assessment:   Type 2 diabetes mellitus with hyperglycemia, without long-term current use of insulin (Dawn Schwartz) - Plan: HM DIABETES FOOT EXAM  Ganglion cyst of finger of right hand  Onychomycosis   Plan:   1-continue Metformin.  Consider checking sugars if she desires. 2-okay to stop home blood pressure monitoring, counseled on diet and exercise.  Continue chlorthalidone and losartan. 3-reassurance given for now, I did offer to aspirate it. 4-recommend topical Vicks VapoRub or Penlac.  Discussed doing nothing versus oral medication as well. F/u in 6 months mo. The patient and her husband voiced understanding and agreement to the plan.  [place], DO 06/15/19 12:08 PM

## 2019-07-01 ENCOUNTER — Ambulatory Visit: Payer: Medicare Other | Attending: Internal Medicine

## 2019-07-01 DIAGNOSIS — Z23 Encounter for immunization: Secondary | ICD-10-CM | POA: Insufficient documentation

## 2019-07-01 NOTE — Progress Notes (Signed)
   Covid-19 Vaccination Clinic  Name:  Dawn Schwartz    MRN: [id number] DOB: 15-Jun-1945  07/01/2019  Dawn Schwartz was observed post Covid-19 immunization for 15 minutes without incident. She was provided with Vaccine Information Sheet and instruction to access the V-Safe system.   Dawn Schwartz was instructed to call 911 with any severe reactions post vaccine: Marland Kitchen Difficulty breathing  . Swelling of face and throat  . A fast heartbeat  . A bad rash all over body  . Dizziness and weakness   Immunizations Administered    Name Date Dose VIS Date Route   Pfizer COVID-19 Vaccine 07/01/2019 12:26 PM 0.3 mL 04/08/2019 Intramuscular   Manufacturer: [place]   Lot: [id number]   [place]: [id number]

## 2019-07-27 IMAGING — US US SOFT TISSUE HEAD/NECK
1 series · 14 of 23 positions shown · non-contrast
Comparison: None.

CLINICAL DATA: Palpable abnormality. Left-sided thyroid enlargement

EXAM:
THYROID ULTRASOUND
TECHNIQUE: Ultrasound examination of the thyroid gland and adjacent soft
tissues was performed.

[Series 1: us soft tissue head/neck · 0.06mm/px · 14 of 23 slices shown]
[im 1/23]
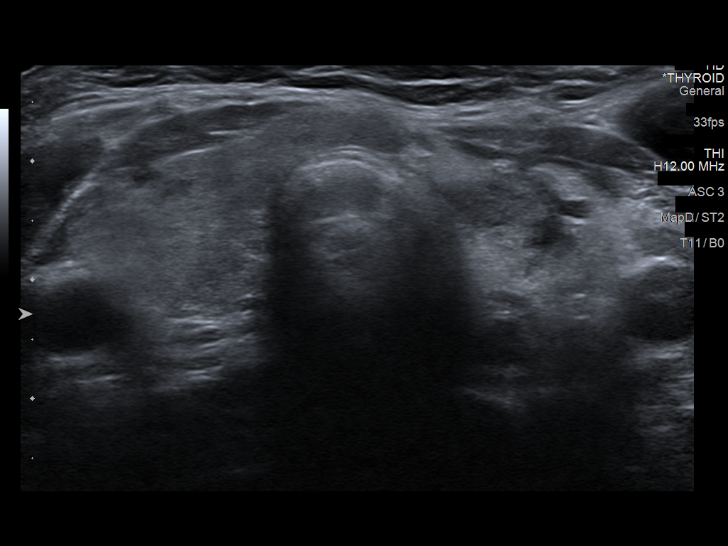
[im 3/23]
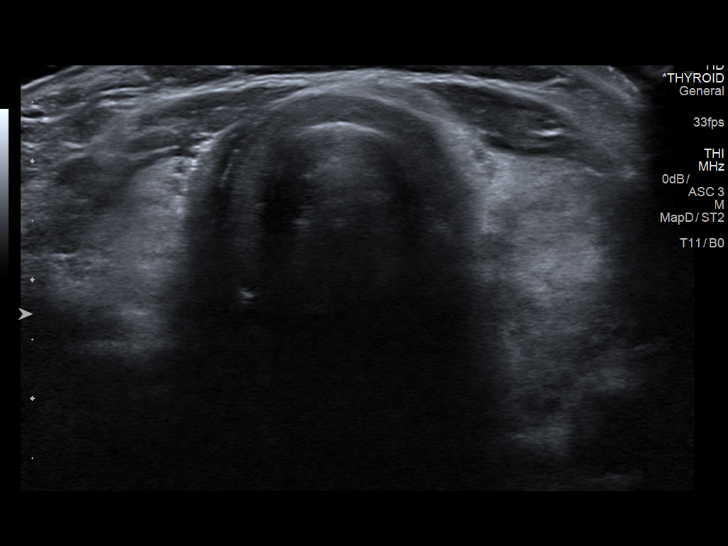
[im 5/23]
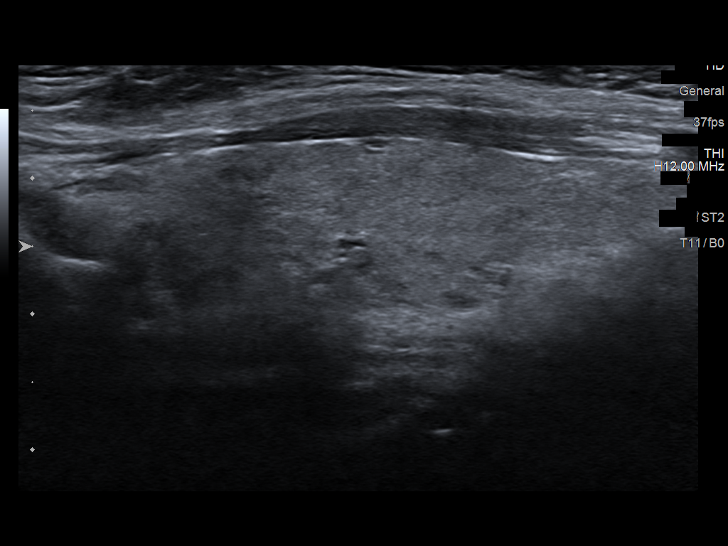
[im 6/23]
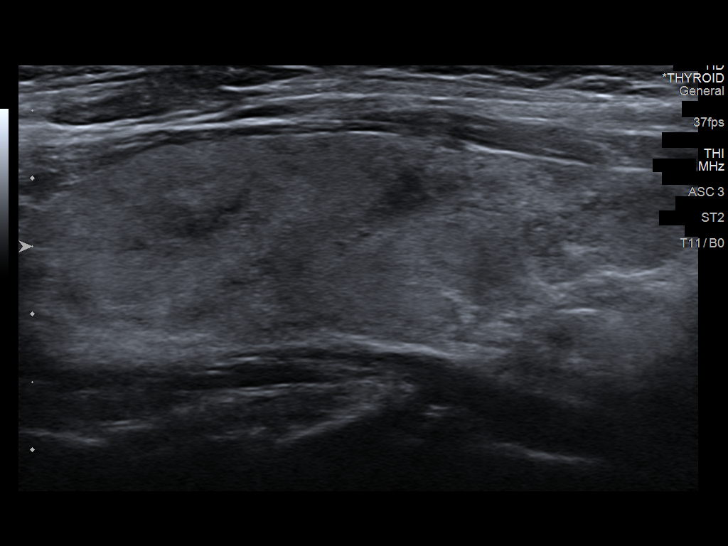
[im 8/23]
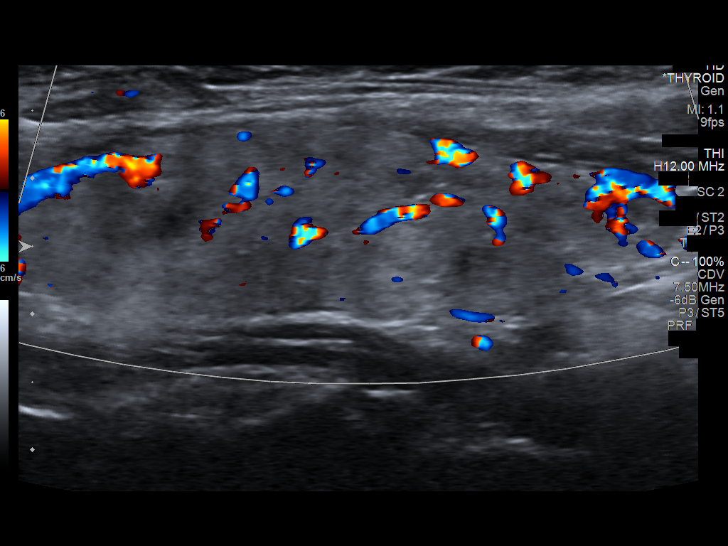
[im 10/23]
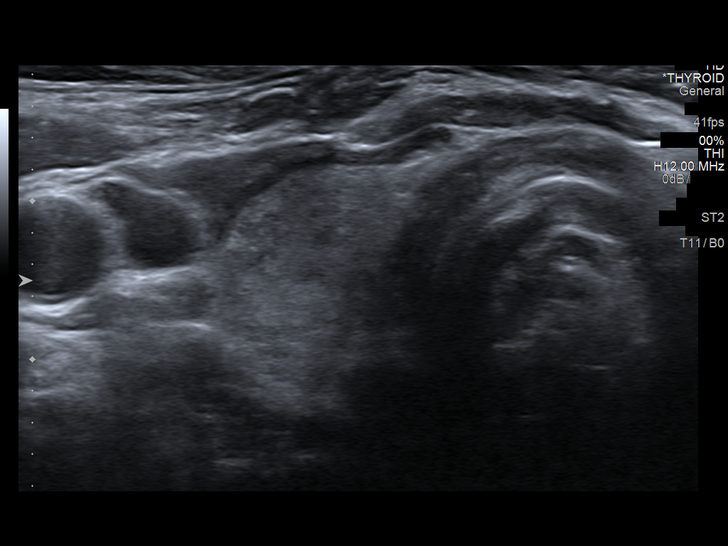
[im 11/23]
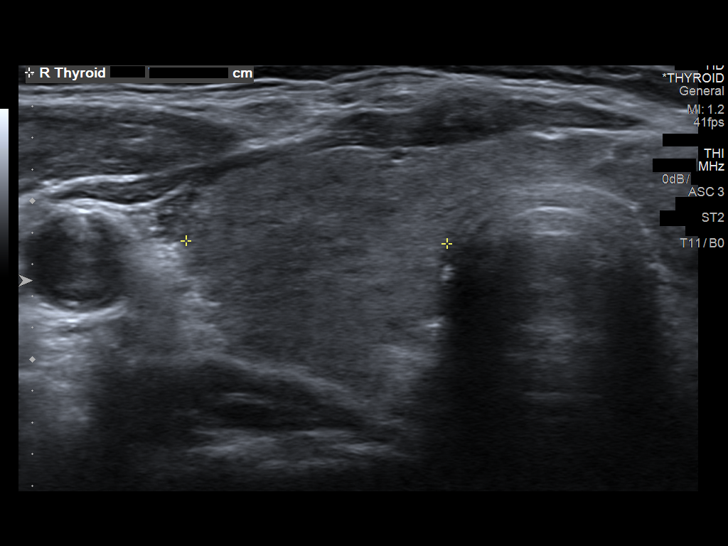
[im 13/23]
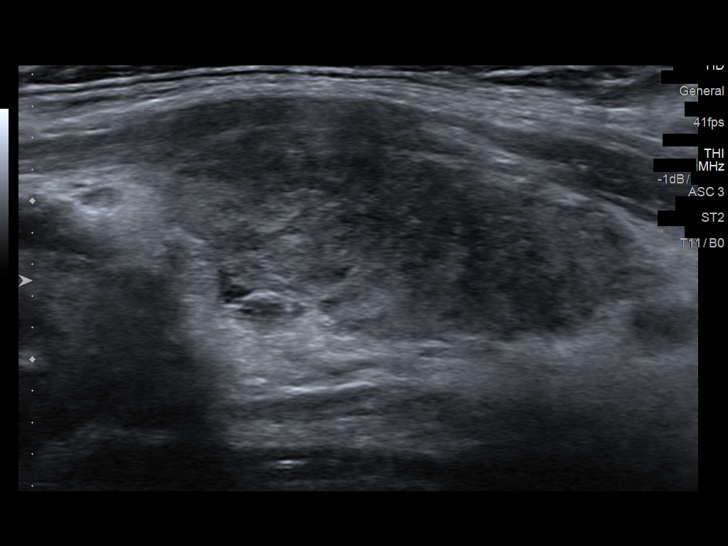
[im 14/23]
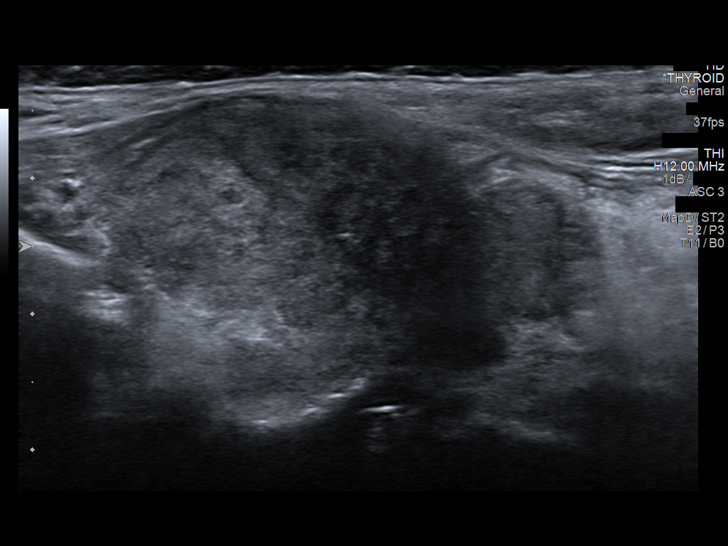
[im 16/23]
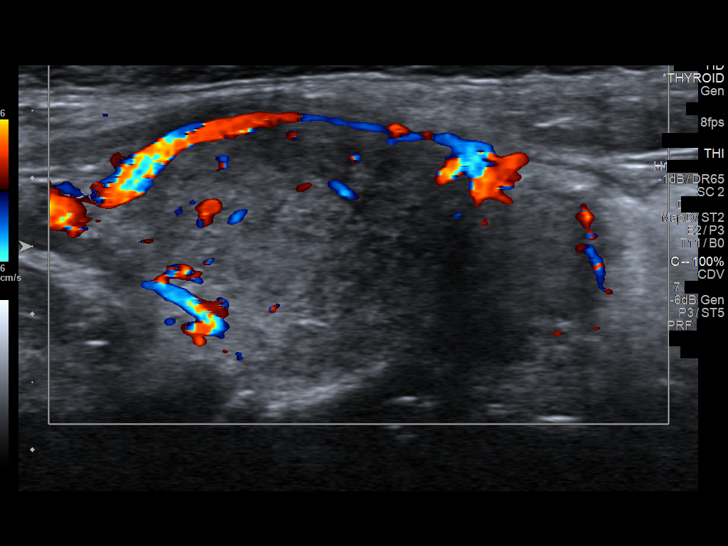
[im 18/23]
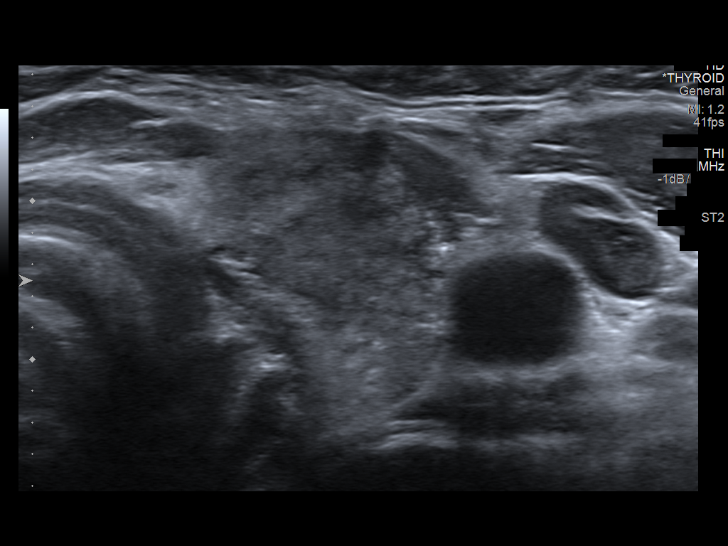
[im 19/23]
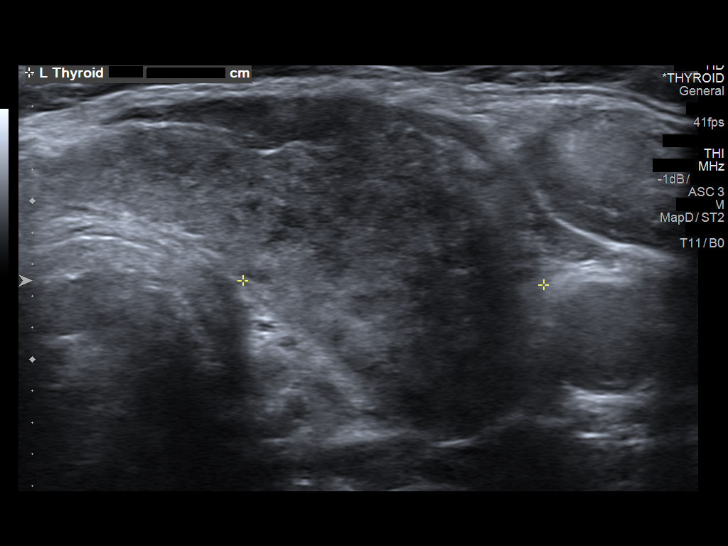
[im 21/23]
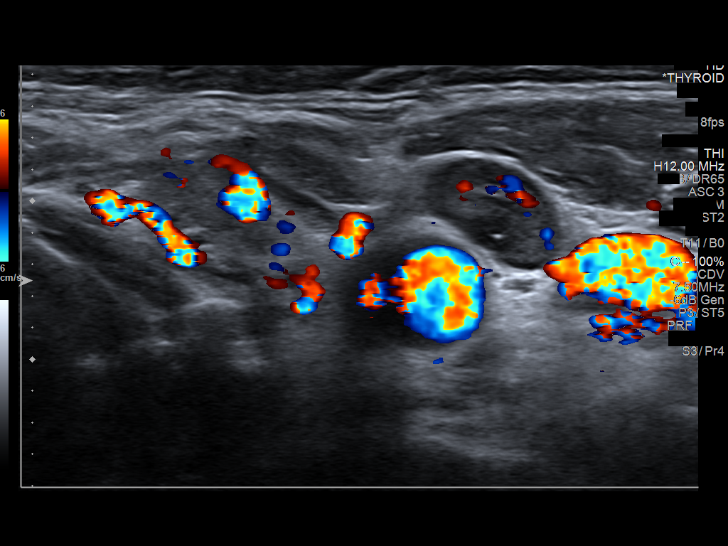
[im 23/23]
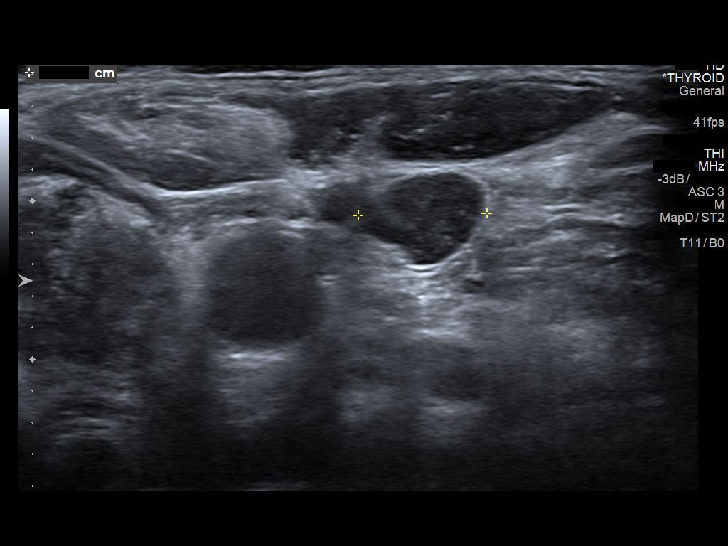

[14 of 23 positions shown; findings below may reference images not displayed]

FINDINGS: Parenchymal Echotexture: Markedly heterogenous

Isthmus: 0.3 cm

Right lobe: 5.1 x 1.4 x 1.7 cm

Left lobe: 4.1 x 1.8 x 1.9 cm

_________________________________________________________

Estimated total number of nodules >/= 1 cm: 0

Number of spongiform nodules >/=  2 cm not described below (TR1): 0

Number of mixed cystic and solid nodules >/= 1.5 cm not described
below (TR2): 0

_________________________________________________________

No discrete nodules are seen within the thyroid gland.
IMPRESSION: Heterogeneous thyroid gland without discrete nodule. The gland is
within normal limits in size.

The above is in keeping with the ACR TI-RADS recommendations - [HOSPITAL] 4059;[DATE].

## 2019-07-27 MED FILL — ATORVASTATIN 40 MG TABLET: 40 | 90 days supply | Qty: 90 | Fill #2

## 2019-08-22 MED FILL — LOSARTAN POTASSIUM 100 MG T: 100 | 90 days supply | Qty: 90 | Fill #1

## 2019-08-22 MED FILL — CHLORTHALIDONE 25 MG TABS: 25 | 90 days supply | Qty: 90 | Fill #1

## 2019-08-22 MED FILL — METFORMIN HCL 1000 MG TABS: 1000 | 90 days supply | Qty: 180 | Fill #3

## 2019-09-21 ENCOUNTER — Other Ambulatory Visit: Payer: Self-pay | Admitting: Family Medicine

## 2019-09-21 DIAGNOSIS — Z0000 Encounter for general adult medical examination without abnormal findings: Secondary | ICD-10-CM

## 2019-09-21 DIAGNOSIS — E1165 Type 2 diabetes mellitus with hyperglycemia: Secondary | ICD-10-CM

## 2019-09-21 DIAGNOSIS — I10 Essential (primary) hypertension: Secondary | ICD-10-CM

## 2019-09-21 DIAGNOSIS — E118 Type 2 diabetes mellitus with unspecified complications: Secondary | ICD-10-CM

## 2019-10-28 MED FILL — ATORVASTATIN CALCIUM 40 MG: 40 | 90 days supply | Qty: 90 | Fill #3

## 2019-11-04 ENCOUNTER — Other Ambulatory Visit (HOSPITAL_BASED_OUTPATIENT_CLINIC_OR_DEPARTMENT_OTHER): Payer: Self-pay | Admitting: Family Medicine

## 2019-11-04 DIAGNOSIS — Z1231 Encounter for screening mammogram for malignant neoplasm of breast: Secondary | ICD-10-CM

## 2019-11-14 ENCOUNTER — Other Ambulatory Visit: Payer: Self-pay

## 2019-11-14 ENCOUNTER — Other Ambulatory Visit: Payer: Self-pay | Admitting: Family Medicine

## 2019-11-14 MED ORDER — LOSARTAN POTASSIUM 100 MG PO TABS: 100.0000 mg | ORAL_TABLET | Freq: Every day | ORAL | 1 refills | Status: DC

## 2019-11-14 MED ORDER — CHLORTHALIDONE 25 MG PO TABS: 25.0000 mg | ORAL_TABLET | Freq: Every day | ORAL | 1 refills | Status: DC

## 2019-11-14 MED ORDER — METFORMIN HCL 1000 MG PO TABS: 1000.0000 mg | ORAL_TABLET | Freq: Two times a day (BID) | ORAL | 1 refills | Status: DC

## 2019-11-14 MED FILL — CHLORTHALIDONE 25 MG TABS: 25 | 90 days supply | Qty: 90 | Fill #0

## 2019-11-14 MED FILL — METFORMIN HCL 1000 MG TABS: 1000 | 90 days supply | Qty: 180 | Fill #0

## 2019-11-14 MED FILL — LOSARTAN POTASSIUM 100 MG T: 100 | 90 days supply | Qty: 90 | Fill #0

## 2019-12-05 ENCOUNTER — Other Ambulatory Visit: Payer: Self-pay

## 2019-12-05 ENCOUNTER — Other Ambulatory Visit (INDEPENDENT_AMBULATORY_CARE_PROVIDER_SITE_OTHER): Payer: Medicare Other

## 2019-12-05 DIAGNOSIS — E1165 Type 2 diabetes mellitus with hyperglycemia: Secondary | ICD-10-CM | POA: Diagnosis not present

## 2019-12-05 DIAGNOSIS — Z0000 Encounter for general adult medical examination without abnormal findings: Secondary | ICD-10-CM

## 2019-12-05 DIAGNOSIS — E118 Type 2 diabetes mellitus with unspecified complications: Secondary | ICD-10-CM

## 2019-12-05 DIAGNOSIS — I10 Essential (primary) hypertension: Secondary | ICD-10-CM

## 2019-12-05 LAB — CBC
HCT: 40.5 % (ref 36.0–46.0)
Hemoglobin: 13.5 g/dL (ref 12.0–15.0)
MCHC: 33.2 g/dL (ref 30.0–36.0)
MCV: 94.6 fl (ref 78.0–100.0)
Platelets: 189 10*3/uL (ref 150.0–400.0)
RBC: 4.28 Mil/uL (ref 3.87–5.11)
RDW: 12.8 % (ref 11.5–15.5)
WBC: 6.9 10*3/uL (ref 4.0–10.5)

## 2019-12-05 LAB — LIPID PANEL
Cholesterol: 100 mg/dL (ref 0–200)
HDL: 46 mg/dL (ref >39.00)
LDL Cholesterol: 30 mg/dL (ref 0–99)
NonHDL: 54.15
Total CHOL/HDL Ratio: 2
Triglycerides: 122 mg/dL (ref 0.0–149.0)
VLDL: 24.4 mg/dL (ref 0.0–40.0)

## 2019-12-05 LAB — COMPREHENSIVE METABOLIC PANEL
ALT: 20 U/L (ref 0–35)
AST: 18 U/L (ref 0–37)
Albumin: 4.5 g/dL (ref 3.5–5.2)
Alkaline Phosphatase: 40 U/L (ref 39–117)
BUN: 14 mg/dL (ref 6–23)
CO2: 30 mEq/L (ref 19–32)
Calcium: 9.4 mg/dL (ref 8.4–10.5)
Chloride: 100 mEq/L (ref 96–112)
Creatinine, Ser: 0.55 mg/dL (ref 0.40–1.20)
GFR: 108.65 mL/min (ref >60.00)
Glucose, Bld: 104 mg/dL — ABNORMAL HIGH (ref 70–99)
Potassium: 3.7 mEq/L (ref 3.5–5.1)
Sodium: 137 mEq/L (ref 135–145)
Total Bilirubin: 0.7 mg/dL (ref 0.2–1.2)
Total Protein: 6.8 g/dL (ref 6.0–8.3)

## 2019-12-05 LAB — MICROALBUMIN / CREATININE URINE RATIO
Creatinine,U: 18 mg/dL
Microalb Creat Ratio: 3.9 mg/g (ref 0.0–30.0)
Microalb, Ur: <0.7 mg/dL (ref 0.0–1.9)

## 2019-12-05 LAB — HEMOGLOBIN A1C: Hgb A1c MFr Bld: 6.9 % — ABNORMAL HIGH (ref 4.6–6.5)

## 2019-12-12 ENCOUNTER — Encounter: Payer: Medicare Other | Admitting: Family Medicine

## 2019-12-13 ENCOUNTER — Other Ambulatory Visit: Payer: Self-pay

## 2019-12-13 ENCOUNTER — Encounter (HOSPITAL_BASED_OUTPATIENT_CLINIC_OR_DEPARTMENT_OTHER): Payer: Self-pay

## 2019-12-13 ENCOUNTER — Ambulatory Visit (HOSPITAL_BASED_OUTPATIENT_CLINIC_OR_DEPARTMENT_OTHER)
Admission: RE | Admit: 2019-12-13 | Discharge: 2019-12-13 | Disposition: A | Discharge status: Home / Self Care | Payer: Medicare Other | Source: Ambulatory Visit | Attending: Family Medicine | Admitting: Family Medicine

## 2019-12-13 DIAGNOSIS — Z1231 Encounter for screening mammogram for malignant neoplasm of breast: Secondary | ICD-10-CM | POA: Diagnosis not present

## 2019-12-16 ENCOUNTER — Encounter: Payer: Medicare Other | Admitting: Family Medicine

## 2019-12-21 ENCOUNTER — Ambulatory Visit (INDEPENDENT_AMBULATORY_CARE_PROVIDER_SITE_OTHER): Payer: Medicare Other | Admitting: Family Medicine

## 2019-12-21 ENCOUNTER — Encounter: Payer: Self-pay | Admitting: Family Medicine

## 2019-12-21 ENCOUNTER — Other Ambulatory Visit: Payer: Self-pay | Admitting: Family Medicine

## 2019-12-21 ENCOUNTER — Other Ambulatory Visit: Payer: Self-pay

## 2019-12-21 VITALS — BP 120/78 | HR 78 | Temp 98.5°F | Ht 62.0 in | Wt 110.0 lb

## 2019-12-21 DIAGNOSIS — W57XXXA Bitten or stung by nonvenomous insect and other nonvenomous arthropods, initial encounter: Secondary | ICD-10-CM

## 2019-12-21 DIAGNOSIS — B351 Tinea unguium: Secondary | ICD-10-CM

## 2019-12-21 DIAGNOSIS — L282 Other prurigo: Secondary | ICD-10-CM

## 2019-12-21 DIAGNOSIS — E1165 Type 2 diabetes mellitus with hyperglycemia: Secondary | ICD-10-CM

## 2019-12-21 DIAGNOSIS — Z0000 Encounter for general adult medical examination without abnormal findings: Secondary | ICD-10-CM

## 2019-12-21 MED ORDER — TERBINAFINE HCL 250 MG PO TABS: 250.0000 mg | ORAL_TABLET | Freq: Every day | ORAL | 3 refills | Status: DC

## 2019-12-21 MED ORDER — TRIAMCINOLONE ACETONIDE 0.1 % EX CREA: 1.0000 "application " | TOPICAL_CREAM | Freq: Two times a day (BID) | CUTANEOUS | 0 refills | Status: DC

## 2019-12-21 MED ORDER — LEVOCETIRIZINE DIHYDROCHLORIDE 5 MG PO TABS: 5.0000 mg | ORAL_TABLET | Freq: Every evening | ORAL | 1 refills | Status: DC

## 2019-12-21 MED ORDER — ONETOUCH ULTRA VI STRP: ORAL_STRIP | 3 refills | Status: DC

## 2019-12-21 MED ORDER — TRIAMCINOLONE ACETONIDE 0.1 % EX CREA
1.0000 | TOPICAL_CREAM | Freq: Two times a day (BID) | CUTANEOUS | 3 refills | Status: DC
Start: 2019-12-21 — End: 2019-12-21

## 2019-12-21 MED FILL — TERBINAFINE HCL 250 MG TAB: 250 | 30 days supply | Qty: 30 | Fill #0

## 2019-12-21 MED FILL — LEVOCETIRIZINE 5 MG TABLET: 5 | 90 days supply | Qty: 90 | Fill #0

## 2019-12-21 MED FILL — ONE TOUCH ULTRA TEST STRIPS: 50 days supply | Qty: 50 | Fill #0

## 2019-12-21 MED FILL — TRIAMCINOLONE ACETONIDE 0.1: 0.1 | 30 days supply | Qty: 80 | Fill #0

## 2019-12-21 NOTE — Progress Notes (Signed)
Chief Complaint  Patient presents with  . Annual Exam    discuss taking vitamin D and other vitamins  . Rash    unable to sleep  . Results    mammogram please discuss     Well Woman Dawn Schwartz is here for a complete physical. Here w aid of [place] interpreter.  Her last physical was >1 year ago.  Current diet: in general, a "healthy" diet. Current exercise: walking. Weight is stable and she denies daytime fatigue. Seatbelt? Yes   Health Maintenance Colonoscopy- Yes Shingrix- No Lung cancer screening- N/A DEXA- Yes Mammogram- Yes Tetanus- Yes Pneumonia- Yes Hep C screen- Yes  Past Medical History:  Diagnosis Date  . Aortic atherosclerosis (HCC)   . Diabetes mellitus type 2 in nonobese (HCC)   . Essential hypertension   . History of Helicobacter pylori infection   . Hypercholesterolemia   . Weight loss      Past Surgical History:  Procedure Laterality Date  . NO PAST SURGERIES      Medications  Current Outpatient Medications on File Prior to Visit  Medication Sig Dispense Refill  . Alcohol Swabs (B-D SINGLE USE SWABS REGULAR) PADS Use daily to check blood sugar.  DX E11.9 100 each 6  . aspirin EC 81 MG tablet Take 81 mg by mouth daily.    . atorvastatin (LIPITOR) 40 MG tablet TAKE 1 TABLET (40 MG TOTAL) BY MOUTH DAILY. 90 tablet 3  . b complex vitamins capsule Take 1 capsule by mouth daily.    . Blood Glucose Monitoring Suppl (ONE TOUCH ULTRA 2) w/Device KIT Use once daily to check blood sugar.  DX E11.9 1 kit 0  . chlorthalidone (HYGROTON) 25 MG tablet Take 1 tablet (25 mg total) by mouth daily. 90 tablet 1  . Lancets (ONETOUCH ULTRASOFT) lancets Use once daily to check blood sugar.  DX E11.9 100 each 3  . losartan (COZAAR) 100 MG tablet Take 1 tablet (100 mg total) by mouth daily. 90 tablet 1  . LUTEIN PO Take 1 tablet by mouth.    . metFORMIN (GLUCOPHAGE) 1000 MG tablet Take 1 tablet (1,000 mg total) by mouth 2 (two) times daily with a meal. 180 tablet 1  .  Multiple Vitamin (MULTIVITAMIN) tablet Take 1 tablet by mouth daily.     Allergies No Known Allergies  Review of Systems: Constitutional:  no fevers Eye:  no recent significant change in vision Ears:  No changes in hearing Nose/Mouth/Throat:  no complaints of nasal congestion, no sore throat Cardiovascular: no chest pain Respiratory:  No shortness of breath Gastrointestinal:  No change in bowel habits GU:  Female: negative for dysuria Integumentary:  no abnormal skin lesions reported Neurologic:  no headaches Endocrine:  denies unexplained weight changes  Exam BP 120/78 (BP Location: Left Arm, Patient Position: Sitting, Cuff Size: Normal)   Pulse 78   Temp 98.5 F (36.9 C) (Oral)   Ht 5' 2" (1.575 m)   Wt 110 lb (49.9 kg)   SpO2 97%   BMI 20.12 kg/m  General:  well developed, well nourished, in no apparent distress Skin:  Onycholysis of medial great toenail on R; otherwiseno significant moles, warts, or growths Head:  no masses, lesions, or tenderness Eyes:  pupils equal and round, sclera anicteric without injection Ears:  canals without lesions, TMs shiny without retraction, no obvious effusion, no erythema Nose:  nares patent, septum midline, mucosa normal, and no drainage or sinus tenderness Throat/Pharynx:  lips and gingiva without lesion;   tongue and uvula midline; non-inflamed pharynx; no exudates or postnasal drainage Neck: neck supple without adenopathy, thyromegaly, or masses Lungs:  clear to auscultation, breath sounds equal bilaterally, no respiratory distress Cardio:  regular rate and rhythm, no bruits or LE edema Abdomen:  abdomen soft, nontender; bowel sounds normal; no masses or organomegaly Genital: Deferred Neuro:  gait normal; deep tendon reflexes normal and symmetric Psych: well oriented with normal range of affect and appropriate judgment/insight  Assessment and Plan  Well adult exam  Type 2 diabetes mellitus with hyperglycemia, without long-term  current use of insulin (HCC) - Plan: Comprehensive metabolic panel, Lipid panel, Hemoglobin A1c  Pruritic rash - Plan: levocetirizine (XYZAL) 5 MG tablet  Insect bite, unspecified site, initial encounter - Plan: triamcinolone cream (KENALOG) 0.1 %  Onychomycosis   Well 74 y.o. female. Counseled on diet and exercise. Other orders as above. Vit D 1000 IU's daily and Ca 1200 mg/d rec'd.  Shingrix rec'd. Would need to get pharmacy.  Stay hydrated. Pickle juice for cramps.  Follow up in 6 mo or prn for DM, 1 mo for reck of skin/toenail. The patient voiced understanding and agreement to the plan.  Nicholas Paul Wendling, DO 12/21/19 1:14 PM  

## 2019-12-21 NOTE — Patient Instructions (Addendum)
Your blood work looked good.  Try to get around 60 oz of water daily.   Try a spoonful of regular pickle juice before bed. This can sometimes help with cramps.   The breast center should be sending you a letter. Your mammogram is normal.  Take 1200 mg of calcium and at least 1000 units of Vit D3 daily. OK to continue a multivitamin if you wish.   I recommend getting the flu shot in mid October. This suggestion would change if the CDC comes out with a different recommendation.   The new Shingrix vaccine (for shingles) is a 2 shot series. It can make people feel low energy, achy and almost like they have the flu for 48 hours after injection. Please plan accordingly when deciding on when to get this shot. Call your pharmacy to get this. The second shot of the series is less severe regarding the side effects, but it still lasts 48 hours.   Your next bone density scan will be due in 2024.   Let us know if you need anything.

## 2020-01-20 MED FILL — TERBINAFINE HCL 250 MG TAB: 250 | 30 days supply | Qty: 30 | Fill #1

## 2020-01-23 ENCOUNTER — Other Ambulatory Visit: Payer: Self-pay | Admitting: Family Medicine

## 2020-01-23 MED FILL — ATORVASTATIN 40 MG TABLET: 40 | 90 days supply | Qty: 90 | Fill #0

## 2020-01-25 ENCOUNTER — Ambulatory Visit (INDEPENDENT_AMBULATORY_CARE_PROVIDER_SITE_OTHER): Payer: Medicare Other | Admitting: Family Medicine

## 2020-01-25 ENCOUNTER — Encounter: Payer: Self-pay | Admitting: Family Medicine

## 2020-01-25 ENCOUNTER — Other Ambulatory Visit: Payer: Self-pay

## 2020-01-25 ENCOUNTER — Other Ambulatory Visit (HOSPITAL_BASED_OUTPATIENT_CLINIC_OR_DEPARTMENT_OTHER): Payer: Self-pay | Admitting: Internal Medicine

## 2020-01-25 VITALS — BP 110/76 | HR 84 | Temp 98.4°F | Ht 62.0 in | Wt 111.5 lb

## 2020-01-25 DIAGNOSIS — Z23 Encounter for immunization: Secondary | ICD-10-CM

## 2020-01-25 DIAGNOSIS — B351 Tinea unguium: Secondary | ICD-10-CM | POA: Diagnosis not present

## 2020-01-25 DIAGNOSIS — R1013 Epigastric pain: Secondary | ICD-10-CM | POA: Diagnosis not present

## 2020-01-25 DIAGNOSIS — Z72820 Sleep deprivation: Secondary | ICD-10-CM

## 2020-01-25 MED ORDER — PANTOPRAZOLE SODIUM 40 MG PO TBEC: 40.0000 mg | DELAYED_RELEASE_TABLET | Freq: Every day | ORAL | 3 refills | Status: DC

## 2020-01-25 MED FILL — PANTOPRAZOLE SOD DR 40 MG T: 40 | 30 days supply | Qty: 30 | Fill #0

## 2020-01-25 MED FILL — SHINGRIX 50 MCG SUS: 50 | 1 days supply | Qty: 1 | Fill #0

## 2020-01-25 NOTE — Patient Instructions (Addendum)
Try taking the Lamisil at night or with food.  Sleep Hygiene Tips:  Do not watch TV or look at screens within 1 hour of going to bed. If you do, make sure there is a blue light filter (nighttime mode) involved.  Try to go to bed around the same time every night. Wake up at the same time within 1 hour of regular time. Ex: If you wake up at 7 AM for work, do not sleep past 8 AM on days that you don't work.  Do not drink alcohol before bedtime.  Do not consume caffeine-containing beverages after noon or within 9 hours of intended bedtime.  Get regular exercise/physical activity in your life, but not within 2 hours of planned bedtime.  Do not take naps.   Do not eat within 2 hours of planned bedtime.  Melatonin, 3-5 mg 30-60 minutes before planned bedtime may be helpful.   The bed should be for sleep or sex only. If after 20-30 minutes you are unable to fall asleep, get up and do something relaxing. Do this until you feel ready to go to sleep again.   Send me a message in a few weeks if no better with sleep. I will send in a medication and we will follow up in a month after starting.   The new Shingrix vaccine (for shingles) is a 2 shot series. It can make people feel low energy, achy and almost like they have the flu for 48 hours after injection. Please plan accordingly when deciding on when to get this shot. Call your pharmacy for an appointment to get this. The second shot of the series is less severe regarding the side effects, but it still lasts 48 hours.   Let us know if you need anything.

## 2020-01-25 NOTE — Addendum Note (Signed)
Addended by: Sharon Seller B on: 01/25/2020 10:43 AM   Modules accepted: Orders

## 2020-01-25 NOTE — Progress Notes (Signed)
Chief Complaint  Patient presents with  . Follow-up    6 month  . Abdominal Pain    Subjective: Patient is a 74 y.o. female here for f/u.  Started on Lamisil for her toenail and a Kenalog for a skin lesion 1 mo ago. Started having abd pain after starting the medication. She takes it in the morning on an empty stomach. The skin is better. The nail is less painful.   3 weeks ago, started having epigastric abd pain. Feels like a "sour stomach". No fevers, N/V/D/C, bleeding. Pepto-Bismol.   Has trouble with stress and has racing thoughts. Wakes up 4-5 times in the middle of the night. Goes to bed around 1030 and wakes up at 730. Does not snore at night. She does not consume caffeine after noon and does not drink EtOH. Milk/bananas to soothe her has not worked. Has not tried anything.   Past Medical History:  Diagnosis Date  . Aortic atherosclerosis ([place])   . Diabetes mellitus type 2 in nonobese (HCC)   . Essential hypertension   . History of Helicobacter pylori infection   . Hypercholesterolemia   . Weight loss     Objective: BP 110/76 (BP Location: Left Arm, Patient Position: Sitting, Cuff Size: Normal)   Pulse 84   Temp 98.4 F (36.9 C) (Oral)   Ht 5\' 2"  (1.575 m)   Wt 111 lb 8 oz (50.6 kg)   SpO2 99%   BMI 20.39 kg/m  General: Awake, appears stated age HEENT: MMM, EOMi Heart: RRR Abd: BS+, S, mild epigastric ttp Lungs: CTAB, no rales, wheezes or rhonchi. No accessory muscle use Psych: Age appropriate judgment and insight, normal affect and mood  Assessment and Plan: Onychomycosis - Plan: Hepatic function panel, CANCELED: Hepatic function panel  Epigastric pain - Plan: pantoprazole (PROTONIX) 40 MG tablet  Poor sleep  1. Cont Lamisil, but take at night. Ck liver function today. 2. Start Protonix for pain.  3. Sleep hygiene, melatonin. If no improvement, she will let me know and we will start trazodone.  F.u in 5 mo or prn. The patient voiced understanding and  agreement to the plan.  [place], DO 01/25/20  10:04 AM

## 2020-01-26 LAB — HEPATIC FUNCTION PANEL
AG Ratio: 2.1 (calc) (ref 1.0–2.5)
ALT: 23 U/L (ref 6–29)
AST: 18 U/L (ref 10–35)
Albumin: 5 g/dL (ref 3.6–5.1)
Alkaline phosphatase (APISO): 52 U/L (ref 37–153)
Bilirubin, Direct: 0.1 mg/dL (ref 0.0–0.2)
Globulin: 2.4 g/dL (calc) (ref 1.9–3.7)
Indirect Bilirubin: 0.5 mg/dL (calc) (ref 0.2–1.2)
Total Bilirubin: 0.6 mg/dL (ref 0.2–1.2)
Total Protein: 7.4 g/dL (ref 6.1–8.1)

## 2020-02-13 MED FILL — CHLORTHALIDONE 25 MG TABS: 25 | 90 days supply | Qty: 90 | Fill #1

## 2020-02-13 MED FILL — TERBINAFINE HCL 250 MG TAB: 250 | 30 days supply | Qty: 30 | Fill #2

## 2020-02-13 MED FILL — LOSARTAN POTASSIUM 100 MG T: 100 | 90 days supply | Qty: 90 | Fill #1

## 2020-02-17 ENCOUNTER — Ambulatory Visit: Payer: Medicare Other | Attending: Internal Medicine

## 2020-02-17 ENCOUNTER — Other Ambulatory Visit (HOSPITAL_BASED_OUTPATIENT_CLINIC_OR_DEPARTMENT_OTHER): Payer: Self-pay | Admitting: Internal Medicine

## 2020-02-17 DIAGNOSIS — Z23 Encounter for immunization: Secondary | ICD-10-CM

## 2020-02-17 NOTE — Progress Notes (Signed)
   Covid-19 Vaccination Clinic  Name:  Dawn Schwartz    MRN: [id number] DOB: [birth year]  02/17/2020  [name] was observed post Covid-19 immunization for 15 minutes without incident. She was provided with Vaccine Information Sheet and instruction to access the V-Safe system.  Vaccinated by [facility]  [name] was instructed to call 911 with any severe reactions post vaccine: Marland Kitchen Difficulty breathing  . Swelling of face and throat  . A fast heartbeat  . A bad rash all over body  . Dizziness and weakness

## 2020-02-20 MED FILL — PANTOPRAZOLE SOD DR 40 MG T: 40 | 30 days supply | Qty: 30 | Fill #1

## 2020-02-22 MED FILL — PFIZER-BIONTECH COVID-19 VA: 30 | 1 days supply | Qty: 0 | Fill #0

## 2020-02-24 MED FILL — METFORMIN HCL 1000 MG TABS: 1000 | 90 days supply | Qty: 180 | Fill #1

## 2020-02-27 MED FILL — LEVOCETIRIZINE 5 MG TABLET: 5 | 90 days supply | Qty: 90 | Fill #1

## 2020-03-20 MED FILL — TERBINAFINE HCL 250 MG TAB: 250 | 30 days supply | Qty: 30 | Fill #3

## 2020-03-20 MED FILL — PANTOPRAZOLE SOD DR 40 MG T: 40 | 30 days supply | Qty: 30 | Fill #2

## 2020-03-20 MED FILL — LEVOCETIRIZINE 5 MG TABLET: 5 | 90 days supply | Qty: 90 | Fill #1

## 2020-03-20 MED FILL — ONE TOUCH ULTRA TEST STRIPS: 50 days supply | Qty: 50 | Fill #1

## 2020-03-30 MED FILL — ONE TOUCH ULTRA TEST STRIPS: 90 days supply | Qty: 100 | Fill #1

## 2020-04-16 ENCOUNTER — Other Ambulatory Visit: Payer: Self-pay | Admitting: Family Medicine

## 2020-04-16 DIAGNOSIS — B351 Tinea unguium: Secondary | ICD-10-CM

## 2020-04-16 MED FILL — TERBINAFINE HCL 250 MG TAB: 250 | 30 days supply | Qty: 30 | Fill #0

## 2020-04-16 MED FILL — ATORVASTATIN CALCIUM 40 MG: 40 | 90 days supply | Qty: 90 | Fill #1

## 2020-04-16 MED FILL — PANTOPRAZOLE SOD DR 40 MG T: 40 | 30 days supply | Qty: 30 | Fill #3

## 2020-04-16 NOTE — Telephone Encounter (Signed)
Should Pt continue terbinafine?

## 2020-05-21 ENCOUNTER — Other Ambulatory Visit: Payer: Self-pay | Admitting: Family Medicine

## 2020-05-21 DIAGNOSIS — R1013 Epigastric pain: Secondary | ICD-10-CM

## 2020-05-21 MED FILL — TERBINAFINE HCL 250 MG TAB: 250 | 30 days supply | Qty: 30 | Fill #1

## 2020-05-21 MED FILL — PANTOPRAZOLE SOD DR 40 MG T: 40 | 30 days supply | Qty: 30 | Fill #0

## 2020-05-21 MED FILL — LOSARTAN POTASSIUM 100 MG T: 100 | 90 days supply | Qty: 90 | Fill #0

## 2020-05-21 MED FILL — METFORMIN HCL 1000 MG TABS: 1000 | 90 days supply | Qty: 180 | Fill #0

## 2020-05-21 MED FILL — CHLORTHALIDONE 25 MG TABS: 25 | 90 days supply | Qty: 90 | Fill #0

## 2020-06-07 MED FILL — ONE TOUCH ULTRA TEST STRIPS: 90 days supply | Qty: 100 | Fill #2

## 2020-06-15 ENCOUNTER — Other Ambulatory Visit (INDEPENDENT_AMBULATORY_CARE_PROVIDER_SITE_OTHER): Payer: Medicare Other

## 2020-06-15 ENCOUNTER — Other Ambulatory Visit: Payer: Self-pay

## 2020-06-15 DIAGNOSIS — E1165 Type 2 diabetes mellitus with hyperglycemia: Secondary | ICD-10-CM

## 2020-06-15 LAB — LIPID PANEL
Cholesterol: 97 mg/dL (ref 0–200)
HDL: 46.2 mg/dL (ref >39.00)
LDL Cholesterol: 39 mg/dL (ref 0–99)
NonHDL: 50.32
Total CHOL/HDL Ratio: 2
Triglycerides: 58 mg/dL (ref 0.0–149.0)
VLDL: 11.6 mg/dL (ref 0.0–40.0)

## 2020-06-15 LAB — COMPREHENSIVE METABOLIC PANEL
ALT: 18 U/L (ref 0–35)
AST: 16 U/L (ref 0–37)
Albumin: 4.6 g/dL (ref 3.5–5.2)
Alkaline Phosphatase: 44 U/L (ref 39–117)
BUN: 15 mg/dL (ref 6–23)
CO2: 34 mEq/L — ABNORMAL HIGH (ref 19–32)
Calcium: 9.7 mg/dL (ref 8.4–10.5)
Chloride: 101 mEq/L (ref 96–112)
Creatinine, Ser: 0.66 mg/dL (ref 0.40–1.20)
GFR: 87.57 mL/min (ref >60.00)
Glucose, Bld: 111 mg/dL — ABNORMAL HIGH (ref 70–99)
Potassium: 4.4 mEq/L (ref 3.5–5.1)
Sodium: 141 mEq/L (ref 135–145)
Total Bilirubin: 0.5 mg/dL (ref 0.2–1.2)
Total Protein: 6.8 g/dL (ref 6.0–8.3)

## 2020-06-15 LAB — HEMOGLOBIN A1C: Hgb A1c MFr Bld: 7 % — ABNORMAL HIGH (ref 4.6–6.5)

## 2020-06-19 MED FILL — PANTOPRAZOLE SOD DR 40 MG T: 40 | 30 days supply | Qty: 30 | Fill #1

## 2020-06-19 MED FILL — TERBINAFINE HCL 250 MG TAB: 250 | 30 days supply | Qty: 30 | Fill #2

## 2020-06-22 ENCOUNTER — Other Ambulatory Visit: Payer: Self-pay | Admitting: Family Medicine

## 2020-06-22 ENCOUNTER — Encounter: Payer: Self-pay | Admitting: Family Medicine

## 2020-06-22 ENCOUNTER — Ambulatory Visit (INDEPENDENT_AMBULATORY_CARE_PROVIDER_SITE_OTHER): Payer: Medicare Other | Admitting: Family Medicine

## 2020-06-22 ENCOUNTER — Other Ambulatory Visit: Payer: Self-pay

## 2020-06-22 VITALS — BP 118/78 | HR 57 | Temp 97.9°F | Ht 60.0 in | Wt 115.1 lb

## 2020-06-22 DIAGNOSIS — G4700 Insomnia, unspecified: Secondary | ICD-10-CM | POA: Diagnosis not present

## 2020-06-22 DIAGNOSIS — L309 Dermatitis, unspecified: Secondary | ICD-10-CM

## 2020-06-22 DIAGNOSIS — I10 Essential (primary) hypertension: Secondary | ICD-10-CM

## 2020-06-22 DIAGNOSIS — E1165 Type 2 diabetes mellitus with hyperglycemia: Secondary | ICD-10-CM | POA: Diagnosis not present

## 2020-06-22 MED ORDER — TRIAMCINOLONE ACETONIDE 0.1 % EX CREA: 1.0000 "application " | TOPICAL_CREAM | Freq: Two times a day (BID) | CUTANEOUS | 0 refills | Status: DC

## 2020-06-22 MED ORDER — TRAZODONE HCL 50 MG PO TABS: 25.0000 mg | ORAL_TABLET | Freq: Every evening | ORAL | 3 refills | Status: DC | PRN

## 2020-06-22 MED FILL — SHINGRIX 50 MCG SUS: 50 | 1 days supply | Qty: 1 | Fill #1

## 2020-06-22 MED FILL — traZODone HCL 50 MG TABS: 50 | 30 days supply | Qty: 30 | Fill #0

## 2020-06-22 MED FILL — TRIAMCINOLONE 0.1% CREAM: 0.1 | 30 days supply | Qty: 80 | Fill #0

## 2020-06-22 NOTE — Progress Notes (Addendum)
Subjective:   Chief Complaint  Patient presents with  . Follow-up    Dawn Schwartz is a 75 y.o. female here for follow-up of diabetes.  Here w aid of a [place] interpreter.  Dawn Schwartz's self monitored glucose range is low 100's.  Patient denies hypoglycemic reactions. She checks her glucose levels several time(s) per day. Patient does not require insulin.   Medications include: metformin 1000 mg bid Diet is healthy.  Exercise: walking  Hypertension Patient presents for hypertension follow up. She does monitor home blood pressures. Blood pressures ranging on average from 120's/70's. She is compliant with medications- chlorthalidone 25 mg/d, losartan 100 mg/d. Patient has these side effects of medication: none Diet/exercise as a above.   Sleep 5 years of freq awakenings at night. She does not have to urinate. Tried melatonin and Rosary without relief. No anxiety or racing thoughts. She does not nap during the day, consume a lot of caffeine or alcohol, hx of rx medication.   Past Medical History:  Diagnosis Date  . Aortic atherosclerosis ([place])   . Diabetes mellitus type 2 in nonobese (HCC)   . Essential hypertension   . History of Helicobacter pylori infection   . Hypercholesterolemia   . Weight loss      Related testing: Retinal exam: Due Pneumovax: done  Objective:  BP 118/78 (BP Location: Left Arm, Patient Position: Sitting, Cuff Size: Normal)   Pulse (!) 57   Temp 97.9 F (36.6 C) (Oral)   Ht 5' (1.524 m)   Wt 115 lb 2 oz (52.2 kg)   SpO2 94%   BMI 22.48 kg/m  General:  Well developed, well nourished, in no apparent distress Skin:  Warm, no pallor or diaphoresis Head:  Normocephalic, atraumatic Eyes:  Pupils equal and round, sclera anicteric without injection  Lungs:  CTAB, no access msc use Cardio:  RRR, no bruits, no LE edema Musculoskeletal:  Symmetrical muscle groups noted without atrophy or deformity Neuro:  Sensation intact to pinprick on feet Psych: Age  appropriate judgment and insight  Assessment:   Type 2 diabetes mellitus with hyperglycemia, without long-term current use of insulin ([place]) - Plan: Ambulatory referral to Ophthalmology  Essential hypertension  Insomnia, unspecified type - Plan: traZODone (DESYREL) 50 MG tablet  Dermatitis - Plan: triamcinolone (KENALOG) 0.1 %   Plan:   1. Currently controlled with an A1c of 7, goal is less than 8 given her age. Continue Metformin 1000 mg twice daily. Does not need to check blood pressure at home. Counseled on diet and exercise. We will refer her to an ophthalmologist. 2. Continue losartan 100 mg daily and chlorthalidone 25 mg daily. Blood pressure is well controlled. She can stop checking at home if she wishes. 3. Sleep hygiene information in addition to [facility] provided. We will start trazodone 25-50 mg nightly as needed. F/u in 1 mo to recheck this. The patient and her spouse voiced understanding and agreement to the plan.  [place], DO 06/22/20 11:46 AM

## 2020-06-22 NOTE — Patient Instructions (Addendum)
Keep the diet clean and stay active.  If you do not hear anything about your referral in the next 1-2 weeks, call our office and ask for an update.  Sleep Hygiene Tips:  Do not watch TV or look at screens within 1 hour of going to bed. If you do, make sure there is a blue light filter (nighttime mode) involved.  Try to go to bed around the same time every night. Wake up at the same time within 1 hour of regular time. Ex: If you wake up at 7 AM for work, do not sleep past 8 AM on days that you don't work.  Do not drink alcohol before bedtime.  Do not consume caffeine-containing beverages after noon or within 9 hours of intended bedtime.  Get regular exercise/physical activity in your life, but not within 2 hours of planned bedtime.  Do not take naps.   Do not eat within 2 hours of planned bedtime.  Melatonin, 3-5 mg 30-60 minutes before planned bedtime may be helpful.   The bed should be for sleep or sex only. If after 20-30 minutes you are unable to fall asleep, get up and do something relaxing. Do this until you feel ready to go to sleep again.   Sleep is important to [place] all. Getting good sleep is imperative to adequate functioning during the day. Work with our counselors who are trained to help people obtain quality sleep. Call [phone] to schedule an appointment or if you are curious about insurance coverage/cost.  Try to drink 55-60 oz of water daily outside of exercise.  Consider taking 200 mg of magnesium for the toe.   Because your blood pressure is well-controlled, you no longer have to check your blood pressure at home anymore unless you wish. Some people check it twice daily every day and some people stop altogether. Either or anything in between is fine. Strong work!  Let us know if you need anything.

## 2020-07-17 MED FILL — ATORVASTATIN CALCIUM 40 MG: 40 | 90 days supply | Qty: 90 | Fill #2

## 2020-07-17 MED FILL — PANTOPRAZOLE SOD DR 40 MG T: 40 | 30 days supply | Qty: 30 | Fill #2

## 2020-07-17 MED FILL — TERBINAFINE HCL 250 MG TAB: 250 | 30 days supply | Qty: 30 | Fill #3

## 2020-07-20 ENCOUNTER — Other Ambulatory Visit: Payer: Self-pay

## 2020-07-20 ENCOUNTER — Ambulatory Visit (INDEPENDENT_AMBULATORY_CARE_PROVIDER_SITE_OTHER): Payer: Medicare Other | Admitting: Family Medicine

## 2020-07-20 ENCOUNTER — Encounter: Payer: Self-pay | Admitting: Family Medicine

## 2020-07-20 VITALS — BP 134/68 | HR 89 | Temp 98.1°F | Ht 62.0 in | Wt 114.0 lb

## 2020-07-20 DIAGNOSIS — H938X1 Other specified disorders of right ear: Secondary | ICD-10-CM | POA: Diagnosis not present

## 2020-07-20 DIAGNOSIS — G4700 Insomnia, unspecified: Secondary | ICD-10-CM

## 2020-07-20 MED ORDER — TRAZODONE HCL 50 MG PO TABS: 50.0000 mg | ORAL_TABLET | Freq: Every evening | ORAL | 3 refills | Status: DC | PRN

## 2020-07-20 MED FILL — traZODone HCL 50 MG TABS: 50 | 30 days supply | Qty: 30 | Fill #1

## 2020-07-20 NOTE — Patient Instructions (Addendum)
You can take 9-10 mg of melatonin nightly.   You can take 2 tabs of the trazodone as needed.  Aim to do some physical exertion for 150 minutes per week. This is typically divided into 5 days per week, 30 minutes per day. The activity should be enough to get your heart rate up. Anything is better than nothing if you have time constraints.  Let me know if you are still having issues.   Continue the fluticasone.   Use an air humidifier until it gets humid out.   Let us know if you need anything.

## 2020-07-20 NOTE — Progress Notes (Signed)
Chief Complaint  Patient presents with  . Follow-up    Subjective: Patient is a 75 y.o. female here for f/u insomnia.  Started on trazodone 25-50 mg qhs prn that was only a little helpful. She is wondering if it is OK to take melatonin. No AE's with the medication. Does not wish to see a specialist.   R ear fullness for a few days. Feels popping when she chews. L ear is fine. No pain, fevers, drainage. Has been using Flonase and Claritin with minimal relief.   Past Medical History:  Diagnosis Date  . Aortic atherosclerosis ([place])   . Diabetes mellitus type 2 in nonobese (HCC)   . Essential hypertension   . History of Helicobacter pylori infection   . Hypercholesterolemia   . Weight loss     Objective: BP 134/68 (BP Location: Left Arm, Patient Position: Sitting, Cuff Size: Normal)   Pulse 89   Temp 98.1 F (36.7 C) (Oral)   Ht 5\' 2"  (1.575 m)   Wt 114 lb (51.7 kg)   SpO2 97%   BMI 20.85 kg/m  General: Awake, appears stated age HEENT: MMM, EOMi, some wax near R TM, canals otherwise neg, TM's neg, nares patent w/o dc Lungs: No accessory muscle use Psych: Age appropriate judgment and insight, normal affect and mood  Assessment and Plan: Insomnia, unspecified type - Plan: traZODone (DESYREL) 50 MG tablet  Ear pressure, right  1. Cont trazodone, OK to use melatonin. 2. INCS.  Flush out cerumen today. F/u in 5 mo for CPE The patient voiced understanding and agreement to the plan.  [place], DO 07/20/20  10:13 AM

## 2020-07-28 ENCOUNTER — Other Ambulatory Visit (HOSPITAL_BASED_OUTPATIENT_CLINIC_OR_DEPARTMENT_OTHER): Payer: Self-pay

## 2020-08-16 ENCOUNTER — Other Ambulatory Visit (HOSPITAL_BASED_OUTPATIENT_CLINIC_OR_DEPARTMENT_OTHER): Payer: Self-pay

## 2020-08-16 ENCOUNTER — Other Ambulatory Visit: Payer: Self-pay | Admitting: Family Medicine

## 2020-08-16 DIAGNOSIS — B351 Tinea unguium: Secondary | ICD-10-CM

## 2020-08-16 MED FILL — Chlorthalidone Tab 25 MG: ORAL | 90 days supply | Qty: 90 | Fill #0 | Status: AC

## 2020-08-16 MED FILL — Losartan Potassium Tab 100 MG: ORAL | 90 days supply | Qty: 90 | Fill #0 | Status: AC

## 2020-08-16 MED FILL — Pantoprazole Sodium EC Tab 40 MG (Base Equiv): ORAL | 30 days supply | Qty: 30 | Fill #0 | Status: AC

## 2020-08-16 MED FILL — Metformin HCl Tab 1000 MG: ORAL | 90 days supply | Qty: 180 | Fill #0 | Status: AC

## 2020-08-17 ENCOUNTER — Other Ambulatory Visit (HOSPITAL_BASED_OUTPATIENT_CLINIC_OR_DEPARTMENT_OTHER): Payer: Self-pay

## 2020-08-21 ENCOUNTER — Other Ambulatory Visit (HOSPITAL_BASED_OUTPATIENT_CLINIC_OR_DEPARTMENT_OTHER): Payer: Self-pay

## 2020-09-03 ENCOUNTER — Other Ambulatory Visit (HOSPITAL_BASED_OUTPATIENT_CLINIC_OR_DEPARTMENT_OTHER): Payer: Self-pay

## 2020-09-03 MED ORDER — ONETOUCH ULTRA VI STRP
ORAL_STRIP | 3 refills | Status: DC
  Filled 2020-09-03: qty 100, 90d supply, fill #0

## 2020-09-05 DIAGNOSIS — H25813 Combined forms of age-related cataract, bilateral: Secondary | ICD-10-CM | POA: Diagnosis not present

## 2020-09-05 DIAGNOSIS — H35033 Hypertensive retinopathy, bilateral: Secondary | ICD-10-CM | POA: Diagnosis not present

## 2020-09-05 DIAGNOSIS — E119 Type 2 diabetes mellitus without complications: Secondary | ICD-10-CM | POA: Diagnosis not present

## 2020-09-05 LAB — HM DIABETES EYE EXAM

## 2020-10-08 ENCOUNTER — Other Ambulatory Visit: Payer: Self-pay | Admitting: Family Medicine

## 2020-10-08 DIAGNOSIS — I10 Essential (primary) hypertension: Secondary | ICD-10-CM

## 2020-10-08 DIAGNOSIS — E1165 Type 2 diabetes mellitus with hyperglycemia: Secondary | ICD-10-CM

## 2020-10-08 NOTE — Progress Notes (Signed)
Labs ordered.

## 2020-10-24 ENCOUNTER — Other Ambulatory Visit (HOSPITAL_BASED_OUTPATIENT_CLINIC_OR_DEPARTMENT_OTHER): Payer: Self-pay

## 2020-10-24 MED FILL — Atorvastatin Calcium Tab 40 MG (Base Equivalent): ORAL | 90 days supply | Qty: 90 | Fill #0 | Status: AC

## 2020-11-12 ENCOUNTER — Other Ambulatory Visit: Payer: Self-pay | Admitting: Family Medicine

## 2020-11-12 ENCOUNTER — Other Ambulatory Visit (HOSPITAL_BASED_OUTPATIENT_CLINIC_OR_DEPARTMENT_OTHER): Payer: Self-pay

## 2020-11-12 MED ORDER — METFORMIN HCL 1000 MG PO TABS
ORAL_TABLET | Freq: Two times a day (BID) | ORAL | 1 refills | Status: DC
  Filled 2020-11-12: qty 180, 90d supply, fill #0
  Filled 2021-02-14: qty 180, 90d supply, fill #1

## 2020-11-12 MED ORDER — LOSARTAN POTASSIUM 100 MG PO TABS
ORAL_TABLET | Freq: Every day | ORAL | 1 refills | Status: DC
  Filled 2020-11-12: qty 90, 90d supply, fill #0
  Filled 2021-02-14: qty 90, 90d supply, fill #1

## 2020-12-06 ENCOUNTER — Other Ambulatory Visit (HOSPITAL_BASED_OUTPATIENT_CLINIC_OR_DEPARTMENT_OTHER): Payer: Self-pay

## 2020-12-06 ENCOUNTER — Other Ambulatory Visit: Payer: Self-pay | Admitting: Family Medicine

## 2020-12-06 MED ORDER — CHLORTHALIDONE 25 MG PO TABS
ORAL_TABLET | Freq: Every day | ORAL | 1 refills | Status: DC
  Filled 2020-12-06: qty 90, 90d supply, fill #0
  Filled 2021-02-27: qty 90, 90d supply, fill #1

## 2020-12-12 ENCOUNTER — Other Ambulatory Visit (HOSPITAL_BASED_OUTPATIENT_CLINIC_OR_DEPARTMENT_OTHER): Payer: Self-pay

## 2020-12-12 ENCOUNTER — Other Ambulatory Visit: Payer: Self-pay

## 2020-12-12 ENCOUNTER — Encounter: Payer: Self-pay | Admitting: Family Medicine

## 2020-12-12 ENCOUNTER — Ambulatory Visit (INDEPENDENT_AMBULATORY_CARE_PROVIDER_SITE_OTHER): Payer: Medicare Other | Admitting: Family Medicine

## 2020-12-12 VITALS — BP 124/72 | HR 98 | Temp 97.9°F | Ht 62.0 in | Wt 112.5 lb

## 2020-12-12 DIAGNOSIS — I889 Nonspecific lymphadenitis, unspecified: Secondary | ICD-10-CM | POA: Diagnosis not present

## 2020-12-12 DIAGNOSIS — K120 Recurrent oral aphthae: Secondary | ICD-10-CM | POA: Diagnosis not present

## 2020-12-12 MED ORDER — LIDOCAINE VISCOUS HCL 2 % MT SOLN
OROMUCOSAL | 0 refills | Status: DC
  Filled 2020-12-12: qty 100, 3d supply, fill #0

## 2020-12-12 MED ORDER — PREDNISONE 20 MG PO TABS
40.0000 mg | ORAL_TABLET | Freq: Every day | ORAL | 0 refills | Status: AC
  Filled 2020-12-12: qty 10, 5d supply, fill #0

## 2020-12-12 NOTE — Progress Notes (Signed)
Chief Complaint  Patient presents with   tongue irratation/ulcer    Dawn Schwartz is a 75 y.o. female here for a skin complaint.  Duration: 3 day Location: under R side of tongue, feels it over R portion of neck Pruritic? No Painful? Yes Drainage? No New soaps/lotions/topicals/detergents? No Sick contacts? No Other associated symptoms: Hurts when eating Therapies tried thus far: Tylenol, mouthwash Denies fevers, bleeding, drainage  Past Medical History:  Diagnosis Date   Aortic atherosclerosis (HCC)    Diabetes mellitus type 2 in nonobese [facility])    Essential hypertension    History of Helicobacter pylori infection    Hypercholesterolemia    Weight loss     BP 124/72   Pulse 98   Temp 97.9 F (36.6 C) (Oral)   Ht '5\' 2"'$  (1.575 m)   Wt 112 lb 8 oz (51 kg)   SpO2 96%   BMI 20.58 kg/m  Gen: awake, alert, appearing stated age Lungs: No accessory muscle use Skin: ulcer noted on the right underside of the tongue with thin rim of erythema surrounding the base.  Lymph: There is a slightly enlarged right submandibular gland with tenderness to palpation.  I do not appreciate any other soft tissue edema. Psych: Age appropriate judgment and insight  Aphthous ulcer - Plan: lidocaine (XYLOCAINE) 2 % solution  Lymphadenitis - Plan: predniSONE (DELTASONE) 20 MG tablet  Orders as above.  This should resolve in the next week or so.  She has an appointment on 8/29 and will let me know if anything changes or has failed to improve at that time. F/u prn. The patient voiced understanding and agreement to the plan.  [place], DO 12/12/20 4:26 PM

## 2020-12-12 NOTE — Patient Instructions (Addendum)
This should resolve over the next week or so. Update me at your follow up on 8/29 if no better.   Avoid prodding/poking this area on your tongue.   Let us know if you need anything.

## 2020-12-17 ENCOUNTER — Other Ambulatory Visit (HOSPITAL_BASED_OUTPATIENT_CLINIC_OR_DEPARTMENT_OTHER): Payer: Self-pay | Admitting: Family Medicine

## 2020-12-17 ENCOUNTER — Other Ambulatory Visit: Payer: Self-pay

## 2020-12-17 ENCOUNTER — Other Ambulatory Visit (INDEPENDENT_AMBULATORY_CARE_PROVIDER_SITE_OTHER): Payer: Medicare Other

## 2020-12-17 DIAGNOSIS — I10 Essential (primary) hypertension: Secondary | ICD-10-CM

## 2020-12-17 DIAGNOSIS — E1165 Type 2 diabetes mellitus with hyperglycemia: Secondary | ICD-10-CM

## 2020-12-17 DIAGNOSIS — Z1231 Encounter for screening mammogram for malignant neoplasm of breast: Secondary | ICD-10-CM

## 2020-12-17 LAB — CBC
HCT: 41.6 % (ref 36.0–46.0)
Hemoglobin: 13.9 g/dL (ref 12.0–15.0)
MCHC: 33.3 g/dL (ref 30.0–36.0)
MCV: 94.9 fl (ref 78.0–100.0)
Platelets: 210 10*3/uL (ref 150.0–400.0)
RBC: 4.39 Mil/uL (ref 3.87–5.11)
RDW: 13.5 % (ref 11.5–15.5)
WBC: 7.3 10*3/uL (ref 4.0–10.5)

## 2020-12-17 LAB — COMPREHENSIVE METABOLIC PANEL
ALT: 26 U/L (ref 0–35)
AST: 16 U/L (ref 0–37)
Albumin: 4.5 g/dL (ref 3.5–5.2)
Alkaline Phosphatase: 47 U/L (ref 39–117)
BUN: 19 mg/dL (ref 6–23)
CO2: 33 mEq/L — ABNORMAL HIGH (ref 19–32)
Calcium: 9.9 mg/dL (ref 8.4–10.5)
Chloride: 97 mEq/L (ref 96–112)
Creatinine, Ser: 0.65 mg/dL (ref 0.40–1.20)
GFR: 87.58 mL/min (ref >60.00)
Glucose, Bld: 98 mg/dL (ref 70–99)
Potassium: 4.7 mEq/L (ref 3.5–5.1)
Sodium: 139 mEq/L (ref 135–145)
Total Bilirubin: 0.7 mg/dL (ref 0.2–1.2)
Total Protein: 7 g/dL (ref 6.0–8.3)

## 2020-12-17 LAB — LIPID PANEL
Cholesterol: 131 mg/dL (ref 0–200)
HDL: 47.4 mg/dL (ref >39.00)
LDL Cholesterol: 49 mg/dL (ref 0–99)
NonHDL: 83.78
Total CHOL/HDL Ratio: 3
Triglycerides: 176 mg/dL — ABNORMAL HIGH (ref 0.0–149.0)
VLDL: 35.2 mg/dL (ref 0.0–40.0)

## 2020-12-17 LAB — MICROALBUMIN / CREATININE URINE RATIO
Creatinine,U: 37.1 mg/dL
Microalb Creat Ratio: 1.9 mg/g (ref 0.0–30.0)
Microalb, Ur: <0.7 mg/dL (ref 0.0–1.9)

## 2020-12-17 LAB — HEMOGLOBIN A1C: Hgb A1c MFr Bld: 7.3 % — ABNORMAL HIGH (ref 4.6–6.5)

## 2020-12-24 ENCOUNTER — Other Ambulatory Visit (HOSPITAL_BASED_OUTPATIENT_CLINIC_OR_DEPARTMENT_OTHER): Payer: Self-pay

## 2020-12-24 ENCOUNTER — Other Ambulatory Visit: Payer: Self-pay

## 2020-12-24 ENCOUNTER — Encounter: Payer: Self-pay | Admitting: Family Medicine

## 2020-12-24 ENCOUNTER — Ambulatory Visit (INDEPENDENT_AMBULATORY_CARE_PROVIDER_SITE_OTHER): Payer: Medicare Other | Admitting: Family Medicine

## 2020-12-24 VITALS — BP 128/80 | HR 82 | Temp 98.0°F | Ht 62.0 in | Wt 111.0 lb

## 2020-12-24 DIAGNOSIS — E1165 Type 2 diabetes mellitus with hyperglycemia: Secondary | ICD-10-CM | POA: Diagnosis not present

## 2020-12-24 DIAGNOSIS — L282 Other prurigo: Secondary | ICD-10-CM | POA: Diagnosis not present

## 2020-12-24 DIAGNOSIS — Z0000 Encounter for general adult medical examination without abnormal findings: Secondary | ICD-10-CM | POA: Diagnosis not present

## 2020-12-24 DIAGNOSIS — R1013 Epigastric pain: Secondary | ICD-10-CM | POA: Diagnosis not present

## 2020-12-24 DIAGNOSIS — G4700 Insomnia, unspecified: Secondary | ICD-10-CM | POA: Diagnosis not present

## 2020-12-24 DIAGNOSIS — B351 Tinea unguium: Secondary | ICD-10-CM

## 2020-12-24 MED ORDER — TERBINAFINE HCL 250 MG PO TABS
ORAL_TABLET | Freq: Every day | ORAL | 2 refills | Status: DC
  Filled 2020-12-24: qty 90, 90d supply, fill #0
  Filled 2021-03-14: qty 90, 90d supply, fill #1
  Filled 2021-06-17: qty 90, 90d supply, fill #2

## 2020-12-24 MED ORDER — TRAZODONE HCL 50 MG PO TABS
50.0000 mg | ORAL_TABLET | Freq: Every evening | ORAL | 2 refills | Status: DC | PRN
Start: 2020-12-24 — End: 2022-01-06
  Filled 2020-12-24: qty 90, 45d supply, fill #0

## 2020-12-24 MED ORDER — PANTOPRAZOLE SODIUM 40 MG PO TBEC
40.0000 mg | DELAYED_RELEASE_TABLET | Freq: Every day | ORAL | 2 refills | Status: DC
Start: 2020-12-24 — End: 2021-07-30
  Filled 2020-12-24: qty 90, 90d supply, fill #0
  Filled 2021-03-14: qty 90, 90d supply, fill #1
  Filled 2021-06-17: qty 90, 90d supply, fill #2

## 2020-12-24 MED ORDER — LEVOCETIRIZINE DIHYDROCHLORIDE 5 MG PO TABS
5.0000 mg | ORAL_TABLET | Freq: Every evening | ORAL | 2 refills | Status: DC
Start: 2020-12-24 — End: 2023-01-12
  Filled 2020-12-24: qty 90, 90d supply, fill #0

## 2020-12-24 NOTE — Patient Instructions (Addendum)
Keep the diet clean and stay active.  I recommend getting the flu shot in mid October. This suggestion would change if the CDC comes out with a different recommendation.   You can use [employer] Vaporub for the nails. This can take a long time to work though (9-15 months with daily use).   Take 1200 mg of calcium daily and at least 1000 units of vitamin D3 daily.   Call your insurance company to see if they cover continuous glucose monitoring and let me know which one the cover. There is a strong chance they will not cover this though.   Add some weight resistance exercise for your upper body (yoga, resistance bands, dumbbell work).   Let us know if you need anything.

## 2020-12-24 NOTE — Progress Notes (Signed)
Chief Complaint  Patient presents with   Annual Exam   Well Woman Dawn Schwartz is here for a complete physical. Here w aid of [place] interpreter.  Her last physical was >1 year ago.  Current diet: in general, a "healthy" diet. Current exercise: walking. Weight is stable and she denies daytime fatigue. Seatbelt? Yes  Health Maintenance Colonoscopy- Yes Shingrix- Yes DEXA- Yes Mammogram- Yes Tetanus- Yes Pneumonia- Yes Hep C screen- Yes  Past Medical History:  Diagnosis Date   Aortic atherosclerosis ([place])    Diabetes mellitus type 2 in nonobese ([place])    Essential hypertension    History of Helicobacter pylori infection    Hypercholesterolemia    Weight loss      Past Surgical History:  Procedure Laterality Date   NO PAST SURGERIES      Medications  Current Outpatient Medications on File Prior to Visit  Medication Sig Dispense Refill   Alcohol Swabs (B-D SINGLE USE SWABS REGULAR) PADS Use daily to check blood sugar.  DX E11.9 100 each 6   aspirin EC 81 MG tablet Take 81 mg by mouth daily.     atorvastatin (LIPITOR) 40 MG tablet TAKE 1 TABLET BY MOUTH ONCE DAILY 90 tablet 3   Blood Glucose Monitoring Suppl (ONE TOUCH ULTRA 2) w/Device KIT Use once daily to check blood sugar.  DX E11.9 1 kit 0   chlorthalidone (HYGROTON) 25 MG tablet TAKE 1 TABLET (25 MG TOTAL) BY MOUTH DAILY. 90 tablet 1   COVID-19 mRNA vaccine, Pfizer, 30 MCG/0.3ML injection INJECT AS DIRECTED .3 mL 0   glucose blood (ONETOUCH ULTRA) test strip Use once daily to check blood sugar 100 each 3   Lancets (ONETOUCH ULTRASOFT) lancets Use once daily to check blood sugar.  DX E11.9 100 each 3   lidocaine (XYLOCAINE) 2 % solution Gargle/rinse with 5-10 mls by mouth every 6 hours as needed for pain over tongue. 100 mL 0   losartan (COZAAR) 100 MG tablet TAKE 1 TABLET (100 MG TOTAL) BY MOUTH DAILY. 90 tablet 1   metFORMIN (GLUCOPHAGE) 1000 MG tablet TAKE 1 TABLET (1,000 MG TOTAL) BY MOUTH 2 (TWO) TIMES DAILY WITH A  MEAL. 180 tablet 1   Multiple Vitamin (MULTIVITAMIN) tablet Take 1 tablet by mouth daily.     traZODone (DESYREL) 50 MG tablet Take 1-2 tablets (50-100 mg total) by mouth at bedtime as needed for sleep. 30 tablet 3   triamcinolone (KENALOG) 0.1 % APPLY 1 APPLICATION ON TO THE SKIN 2 TIMES DAILY 80 g 0   levocetirizine (XYZAL) 5 MG tablet TAKE 1 TABLET (5 MG TOTAL) BY MOUTH EVERY EVENING. 90 tablet 1    Allergies No Known Allergies  Review of Systems: Constitutional:  no fevers Eye:  no recent significant change in vision Ears:  No changes in hearing Nose/Mouth/Throat:  no complaints of nasal congestion, no sore throat Cardiovascular: no chest pain Respiratory:  No shortness of breath Gastrointestinal:  +reflex GU:  Female: negative for dysuria Integumentary:  no abnormal skin lesions reported Neurologic:  no headaches Endocrine:  denies unexplained weight changes  Exam BP 128/80   Pulse 82   Temp 98 F (36.7 C) (Oral)   Ht 5' 2"  (1.575 m)   Wt 111 lb (50.3 kg)   SpO2 98%   BMI 20.30 kg/m  General:  well developed, well nourished, in no apparent distress Skin:  no significant moles, warts, or growths Head:  no masses, lesions, or tenderness Eyes:  pupils equal and round,  sclera anicteric without injection Ears:  canals without lesions, TMs shiny without retraction, no obvious effusion, no erythema Nose:  nares patent, septum midline, mucosa normal, and no drainage or sinus tenderness Throat/Pharynx:  lips and gingiva without lesion; tongue and uvula midline; non-inflamed pharynx; no exudates or postnasal drainage Neck: neck supple without adenopathy, thyromegaly, or masses Lungs:  clear to auscultation, breath sounds equal bilaterally, no respiratory distress Cardio:  regular rate and rhythm, no bruits or LE edema Abdomen:  abdomen soft, nontender; bowel sounds normal; no masses or organomegaly Genital: Deferred Neuro:  gait normal; deep tendon reflexes normal and  symmetric Psych: well oriented with normal range of affect and appropriate judgment/insight  Assessment and Plan  Well adult exam  Type 2 diabetes mellitus with hyperglycemia, without long-term current use of insulin (HCC) - Plan: Comprehensive metabolic panel, Lipid panel, Hemoglobin A1c  Onychomycosis - Plan: terbinafine (LAMISIL) 250 MG tablet  Epigastric pain - Plan: pantoprazole (PROTONIX) 40 MG tablet  Pruritic rash - Plan: levocetirizine (XYZAL) 5 MG tablet  Insomnia, unspecified type - Plan: traZODone (DESYREL) 50 MG tablet   Well 75 y.o. female. Counseled on diet and exercise. Med refills as above. She is doing well. Will keep her on Lamisil a little longer, can stop when it resolves. She has been on in hte past.  Other orders as above. Follow up in 6 mo or prn. The patient through the interpreter voiced understanding and agreement to the plan.  [place], DO 12/24/20 10:50 AM

## 2021-01-14 ENCOUNTER — Ambulatory Visit (HOSPITAL_BASED_OUTPATIENT_CLINIC_OR_DEPARTMENT_OTHER)
Admission: RE | Admit: 2021-01-14 | Discharge: 2021-01-14 | Disposition: A | Discharge status: Home / Self Care | Payer: Medicare Other | Source: Ambulatory Visit | Attending: Family Medicine | Admitting: Family Medicine

## 2021-01-14 ENCOUNTER — Encounter (HOSPITAL_BASED_OUTPATIENT_CLINIC_OR_DEPARTMENT_OTHER): Payer: Self-pay

## 2021-01-14 ENCOUNTER — Other Ambulatory Visit: Payer: Self-pay

## 2021-01-14 DIAGNOSIS — Z1231 Encounter for screening mammogram for malignant neoplasm of breast: Secondary | ICD-10-CM | POA: Diagnosis not present

## 2021-01-21 ENCOUNTER — Other Ambulatory Visit: Payer: Self-pay | Admitting: Family Medicine

## 2021-01-21 DIAGNOSIS — R928 Other abnormal and inconclusive findings on diagnostic imaging of breast: Secondary | ICD-10-CM

## 2021-01-22 ENCOUNTER — Telehealth: Payer: Self-pay | Admitting: Family Medicine

## 2021-01-22 NOTE — Telephone Encounter (Signed)
Would wait until further testing is completed. This could be nothing but want to be sure. Ty.

## 2021-01-22 NOTE — Telephone Encounter (Signed)
Called the patient and spoke to her husband  Dawn Schwartz the patients husband [facility] number [phone]. Also did let the Breast know ok to call back the husband to schedule appts. Informed the scheduler to use the interpreter line at [phone] to make easier for scheduling.

## 2021-01-22 NOTE — Telephone Encounter (Signed)
Called [facility] [phone] further imaging will be done. To Clarify how the patient will be contacted to let the Patient/husband know.

## 2021-01-22 NOTE — Telephone Encounter (Signed)
Pt husband called regarding mammogram concerns. Please advise. [phone]

## 2021-01-28 ENCOUNTER — Other Ambulatory Visit: Payer: Self-pay | Admitting: Family Medicine

## 2021-01-28 ENCOUNTER — Other Ambulatory Visit (HOSPITAL_BASED_OUTPATIENT_CLINIC_OR_DEPARTMENT_OTHER): Payer: Self-pay

## 2021-01-28 MED ORDER — INFLUENZA VAC A&B SA ADJ QUAD 0.5 ML IM PRSY
PREFILLED_SYRINGE | INTRAMUSCULAR | 0 refills | Status: DC
  Filled 2021-01-28: qty 0.5, 1d supply, fill #0

## 2021-01-28 MED ORDER — ATORVASTATIN CALCIUM 40 MG PO TABS
40.0000 mg | ORAL_TABLET | Freq: Every day | ORAL | 3 refills | Status: DC
  Filled 2021-01-28: qty 90, 90d supply, fill #0
  Filled 2021-04-26: qty 90, 90d supply, fill #1
  Filled 2021-07-25: qty 90, 90d supply, fill #2
  Filled 2021-10-17: qty 90, 90d supply, fill #3

## 2021-02-08 ENCOUNTER — Other Ambulatory Visit: Payer: Medicare Other

## 2021-02-08 ENCOUNTER — Inpatient Hospital Stay: Admission: RE | Admit: 2021-02-08 | Payer: Medicare Other | Source: Ambulatory Visit

## 2021-02-13 ENCOUNTER — Ambulatory Visit
Admission: RE | Admit: 2021-02-13 | Discharge: 2021-02-13 | Disposition: A | Discharge status: Home / Self Care | Payer: Medicare Other | Source: Ambulatory Visit | Attending: Family Medicine | Admitting: Family Medicine

## 2021-02-13 ENCOUNTER — Other Ambulatory Visit: Payer: Self-pay

## 2021-02-13 ENCOUNTER — Ambulatory Visit: Admission: RE | Admit: 2021-02-13 | Payer: Medicare Other | Source: Ambulatory Visit

## 2021-02-13 DIAGNOSIS — R928 Other abnormal and inconclusive findings on diagnostic imaging of breast: Secondary | ICD-10-CM

## 2021-02-13 DIAGNOSIS — R922 Inconclusive mammogram: Secondary | ICD-10-CM | POA: Diagnosis not present

## 2021-02-14 ENCOUNTER — Other Ambulatory Visit (HOSPITAL_BASED_OUTPATIENT_CLINIC_OR_DEPARTMENT_OTHER): Payer: Self-pay

## 2021-02-22 ENCOUNTER — Other Ambulatory Visit: Payer: Self-pay | Admitting: Family Medicine

## 2021-02-22 ENCOUNTER — Other Ambulatory Visit (HOSPITAL_BASED_OUTPATIENT_CLINIC_OR_DEPARTMENT_OTHER): Payer: Self-pay

## 2021-02-22 MED ORDER — ONETOUCH ULTRA VI STRP
ORAL_STRIP | 12 refills | Status: DC
Start: 2021-02-22 — End: 2022-04-03
  Filled 2021-02-22: qty 100, 90d supply, fill #0
  Filled 2021-05-22: qty 100, 90d supply, fill #1

## 2021-02-22 MED ORDER — ONETOUCH ULTRASOFT LANCETS MISC
12 refills | Status: DC
  Filled 2021-02-22: qty 100, 90d supply, fill #0
  Filled 2021-09-13: qty 100, 90d supply, fill #1

## 2021-02-27 ENCOUNTER — Other Ambulatory Visit (HOSPITAL_BASED_OUTPATIENT_CLINIC_OR_DEPARTMENT_OTHER): Payer: Self-pay

## 2021-03-06 ENCOUNTER — Ambulatory Visit: Payer: Medicare Other | Attending: Internal Medicine

## 2021-03-06 DIAGNOSIS — Z23 Encounter for immunization: Secondary | ICD-10-CM

## 2021-03-06 NOTE — Progress Notes (Signed)
   Covid-19 Vaccination Clinic  Name:  Dawn Schwartz    MRN: [id number] DOB: [birth year]  03/06/2021  [name] was observed post Covid-19 immunization for 15 minutes without incident. She was provided with Vaccine Information Sheet and instruction to access the V-Safe system.   [name] was instructed to call 911 with any severe reactions post vaccine: Difficulty breathing  Swelling of face and throat  A fast heartbeat  A bad rash all over body  Dizziness and weakness   Immunizations Administered     Name Date Dose VIS Date Route   Pfizer Covid-19 Vaccine Bivalent Booster 03/06/2021 10:37 AM 0.3 mL 12/26/2020 Intramuscular   Manufacturer: [place]   Lot: [id number]   [place]: [place], PharmD, MBA Clinical Acute Care Pharmacist

## 2021-03-14 ENCOUNTER — Other Ambulatory Visit (HOSPITAL_BASED_OUTPATIENT_CLINIC_OR_DEPARTMENT_OTHER): Payer: Self-pay

## 2021-03-25 ENCOUNTER — Other Ambulatory Visit (HOSPITAL_BASED_OUTPATIENT_CLINIC_OR_DEPARTMENT_OTHER): Payer: Self-pay

## 2021-03-25 MED ORDER — PFIZER COVID-19 VAC BIVALENT 30 MCG/0.3ML IM SUSP
INTRAMUSCULAR | 0 refills | Status: DC
  Filled 2021-03-25: qty 0.3, 1d supply, fill #0

## 2021-04-26 ENCOUNTER — Other Ambulatory Visit (HOSPITAL_BASED_OUTPATIENT_CLINIC_OR_DEPARTMENT_OTHER): Payer: Self-pay

## 2021-05-13 ENCOUNTER — Other Ambulatory Visit (HOSPITAL_BASED_OUTPATIENT_CLINIC_OR_DEPARTMENT_OTHER): Payer: Self-pay

## 2021-05-13 ENCOUNTER — Other Ambulatory Visit: Payer: Self-pay | Admitting: Family Medicine

## 2021-05-13 MED ORDER — LOSARTAN POTASSIUM 100 MG PO TABS
ORAL_TABLET | Freq: Every day | ORAL | 1 refills | Status: DC
  Filled 2021-05-13: qty 90, 90d supply, fill #0
  Filled 2021-08-09: qty 90, 90d supply, fill #1

## 2021-05-22 ENCOUNTER — Other Ambulatory Visit (HOSPITAL_BASED_OUTPATIENT_CLINIC_OR_DEPARTMENT_OTHER): Payer: Self-pay

## 2021-05-22 ENCOUNTER — Other Ambulatory Visit: Payer: Self-pay | Admitting: Family Medicine

## 2021-05-22 MED ORDER — METFORMIN HCL 1000 MG PO TABS
ORAL_TABLET | Freq: Two times a day (BID) | ORAL | 1 refills | Status: DC
  Filled 2021-05-22: qty 180, 90d supply, fill #0
  Filled 2021-08-20: qty 180, 90d supply, fill #1

## 2021-05-22 MED ORDER — CHLORTHALIDONE 25 MG PO TABS
ORAL_TABLET | Freq: Every day | ORAL | 1 refills | Status: DC
  Filled 2021-05-22: qty 90, 90d supply, fill #0
  Filled 2021-09-05: qty 90, 90d supply, fill #1

## 2021-06-17 ENCOUNTER — Other Ambulatory Visit (INDEPENDENT_AMBULATORY_CARE_PROVIDER_SITE_OTHER): Payer: Medicare Other

## 2021-06-17 ENCOUNTER — Other Ambulatory Visit (HOSPITAL_BASED_OUTPATIENT_CLINIC_OR_DEPARTMENT_OTHER): Payer: Self-pay

## 2021-06-17 DIAGNOSIS — E1165 Type 2 diabetes mellitus with hyperglycemia: Secondary | ICD-10-CM

## 2021-06-17 LAB — HEMOGLOBIN A1C: Hgb A1c MFr Bld: 7.5 % — ABNORMAL HIGH (ref 4.6–6.5)

## 2021-06-17 LAB — COMPREHENSIVE METABOLIC PANEL
ALT: 28 U/L (ref 0–35)
AST: 20 U/L (ref 0–37)
Albumin: 4.9 g/dL (ref 3.5–5.2)
Alkaline Phosphatase: 52 U/L (ref 39–117)
BUN: 12 mg/dL (ref 6–23)
CO2: 36 mEq/L — ABNORMAL HIGH (ref 19–32)
Calcium: 9.9 mg/dL (ref 8.4–10.5)
Chloride: 95 mEq/L — ABNORMAL LOW (ref 96–112)
Creatinine, Ser: 0.64 mg/dL (ref 0.40–1.20)
GFR: 87.61 mL/min (ref >60.00)
Glucose, Bld: 137 mg/dL — ABNORMAL HIGH (ref 70–99)
Potassium: 4 mEq/L (ref 3.5–5.1)
Sodium: 136 mEq/L (ref 135–145)
Total Bilirubin: 0.7 mg/dL (ref 0.2–1.2)
Total Protein: 7.2 g/dL (ref 6.0–8.3)

## 2021-06-17 LAB — LIPID PANEL
Cholesterol: 120 mg/dL (ref 0–200)
HDL: 49 mg/dL (ref >39.00)
LDL Cholesterol: 45 mg/dL (ref 0–99)
NonHDL: 71.31
Total CHOL/HDL Ratio: 2
Triglycerides: 134 mg/dL (ref 0.0–149.0)
VLDL: 26.8 mg/dL (ref 0.0–40.0)

## 2021-06-24 ENCOUNTER — Other Ambulatory Visit: Payer: Self-pay | Admitting: Family Medicine

## 2021-06-24 ENCOUNTER — Other Ambulatory Visit (HOSPITAL_BASED_OUTPATIENT_CLINIC_OR_DEPARTMENT_OTHER): Payer: Self-pay

## 2021-06-24 ENCOUNTER — Encounter: Payer: Self-pay | Admitting: Family Medicine

## 2021-06-24 ENCOUNTER — Ambulatory Visit (INDEPENDENT_AMBULATORY_CARE_PROVIDER_SITE_OTHER): Payer: Medicare Other | Admitting: Family Medicine

## 2021-06-24 VITALS — BP 134/72 | HR 78 | Temp 98.3°F | Ht 61.5 in | Wt 113.0 lb

## 2021-06-24 DIAGNOSIS — Z0000 Encounter for general adult medical examination without abnormal findings: Secondary | ICD-10-CM

## 2021-06-24 DIAGNOSIS — I10 Essential (primary) hypertension: Secondary | ICD-10-CM

## 2021-06-24 DIAGNOSIS — E1165 Type 2 diabetes mellitus with hyperglycemia: Secondary | ICD-10-CM

## 2021-06-24 DIAGNOSIS — I700 Atherosclerosis of aorta: Secondary | ICD-10-CM

## 2021-06-24 NOTE — Progress Notes (Signed)
Subjective:  No chief complaint on file.   Dawn Schwartz is a 76 y.o. female here for follow-up of diabetes.   [name]'s self monitored glucose range is low-mid 100's.  Patient denies hypoglycemic reactions. She checks her glucose levels 2 time(s) per day. Patient does not require insulin.   Medications include: metformin 1000 mg bid Diet is fair.  Exercise: walking, some lifting  Hypertension Patient presents for hypertension follow up. She does not monitor home blood pressures. She is compliant with medications- losartan 100 mg/d, chlorthalidone 25 mg/d. Patient has these side effects of medication: none Diet/exercise as above. No CP or SOB.   Past Medical History:  Diagnosis Date   Aortic atherosclerosis ([place])    Diabetes mellitus type 2 in nonobese [facility])    Essential hypertension    History of Helicobacter pylori infection    Hypercholesterolemia    Weight loss      Related testing: Retinal exam: Due Pneumovax: done  Objective:  BP 134/72    Pulse 78    Temp 98.3 F (36.8 C) (Oral)    Ht 5' 1.5" (1.562 m)    Wt 113 lb (51.3 kg)    SpO2 97%    BMI 21.01 kg/m  General:  Well developed, well nourished, in no apparent distress Skin:  Warm, no pallor or diaphoresis Head:  Normocephalic, atraumatic Eyes:  Pupils equal and round, sclera anicteric without injection  Lungs:  CTAB, no access msc use Cardio:  RRR, no bruits, no LE edema Musculoskeletal:  Symmetrical muscle groups noted without atrophy or deformity Neuro:  Sensation intact to pinprick on feet Psych: Age appropriate judgment and insight  Assessment:   Type 2 diabetes mellitus with hyperglycemia, without long-term current use of insulin (HCC)  Essential hypertension  Aortic atherosclerosis ([place]), Chronic   Plan:   Chronic, stable. Cont Metformin 1000 mg/d. Counseled on diet and exercise. Chronic, stable. Cont losartan 100 mg/d, chlorthalidone 25 mg/d.  Chronic, stable. Cont Lipitor 40 mg/d.  F/u in 6  mo. The patient voiced understanding and agreement to the plan.  [place], DO 06/24/21 10:41 AM

## 2021-06-24 NOTE — Patient Instructions (Addendum)
Keep the diet clean and stay active.  For the next 2 weeks, take pantoprazole 40 mg twice daily instead of daily.   The only lifestyle changes that have data behind them are weight loss for the overweight/obese and elevating the head of the bed. Finding out which foods/positions are triggers is important.  Let us know if you need anything.

## 2021-07-25 ENCOUNTER — Other Ambulatory Visit (HOSPITAL_BASED_OUTPATIENT_CLINIC_OR_DEPARTMENT_OTHER): Payer: Self-pay

## 2021-07-30 ENCOUNTER — Other Ambulatory Visit (HOSPITAL_BASED_OUTPATIENT_CLINIC_OR_DEPARTMENT_OTHER): Payer: Self-pay

## 2021-07-30 ENCOUNTER — Other Ambulatory Visit: Payer: Self-pay | Admitting: Family Medicine

## 2021-07-30 DIAGNOSIS — R1013 Epigastric pain: Secondary | ICD-10-CM

## 2021-07-30 MED ORDER — PANTOPRAZOLE SODIUM 40 MG PO TBEC
40.0000 mg | DELAYED_RELEASE_TABLET | Freq: Every day | ORAL | 2 refills | Status: DC
  Filled 2021-07-30: qty 90, 90d supply, fill #0

## 2021-08-01 ENCOUNTER — Other Ambulatory Visit: Payer: Self-pay | Admitting: Family Medicine

## 2021-08-01 ENCOUNTER — Other Ambulatory Visit (HOSPITAL_BASED_OUTPATIENT_CLINIC_OR_DEPARTMENT_OTHER): Payer: Self-pay

## 2021-08-01 DIAGNOSIS — R1013 Epigastric pain: Secondary | ICD-10-CM

## 2021-08-01 MED ORDER — PANTOPRAZOLE SODIUM 40 MG PO TBEC
40.0000 mg | DELAYED_RELEASE_TABLET | Freq: Every day | ORAL | 2 refills | Status: DC
  Filled 2021-08-01 – 2021-09-05 (×3): qty 90, 90d supply, fill #0
  Filled 2021-11-22: qty 90, 90d supply, fill #1
  Filled 2022-02-21: qty 90, 90d supply, fill #2

## 2021-08-09 ENCOUNTER — Other Ambulatory Visit (HOSPITAL_BASED_OUTPATIENT_CLINIC_OR_DEPARTMENT_OTHER): Payer: Self-pay

## 2021-08-20 ENCOUNTER — Other Ambulatory Visit (HOSPITAL_BASED_OUTPATIENT_CLINIC_OR_DEPARTMENT_OTHER): Payer: Self-pay

## 2021-08-27 ENCOUNTER — Other Ambulatory Visit (HOSPITAL_BASED_OUTPATIENT_CLINIC_OR_DEPARTMENT_OTHER): Payer: Self-pay

## 2021-09-05 ENCOUNTER — Other Ambulatory Visit (HOSPITAL_BASED_OUTPATIENT_CLINIC_OR_DEPARTMENT_OTHER): Payer: Self-pay

## 2021-09-06 ENCOUNTER — Other Ambulatory Visit (HOSPITAL_BASED_OUTPATIENT_CLINIC_OR_DEPARTMENT_OTHER): Payer: Self-pay

## 2021-09-13 ENCOUNTER — Other Ambulatory Visit: Payer: Self-pay | Admitting: Family Medicine

## 2021-09-13 ENCOUNTER — Other Ambulatory Visit (HOSPITAL_BASED_OUTPATIENT_CLINIC_OR_DEPARTMENT_OTHER): Payer: Self-pay

## 2021-09-13 DIAGNOSIS — B351 Tinea unguium: Secondary | ICD-10-CM

## 2021-09-13 MED ORDER — TERBINAFINE HCL 250 MG PO TABS
ORAL_TABLET | Freq: Every day | ORAL | 2 refills | Status: DC
  Filled 2021-09-13: qty 90, 90d supply, fill #0
  Filled 2021-12-17: qty 90, 90d supply, fill #1
  Filled 2022-03-17: qty 90, 90d supply, fill #2

## 2021-10-17 ENCOUNTER — Other Ambulatory Visit (HOSPITAL_BASED_OUTPATIENT_CLINIC_OR_DEPARTMENT_OTHER): Payer: Self-pay

## 2021-11-08 ENCOUNTER — Other Ambulatory Visit (HOSPITAL_BASED_OUTPATIENT_CLINIC_OR_DEPARTMENT_OTHER): Payer: Self-pay

## 2021-11-08 ENCOUNTER — Other Ambulatory Visit: Payer: Self-pay | Admitting: Family Medicine

## 2021-11-08 MED ORDER — LOSARTAN POTASSIUM 100 MG PO TABS
ORAL_TABLET | Freq: Every day | ORAL | 1 refills | Status: DC
Start: 2021-11-08 — End: 2022-05-05
  Filled 2021-11-08: qty 90, 90d supply, fill #0
  Filled 2022-01-21: qty 90, 90d supply, fill #1

## 2021-11-14 IMAGING — MG DIGITAL SCREENING BILAT W/ TOMO W/ CAD
6 of 12 series · 6 of 36 positions shown · non-contrast
Comparison: Previous exam(s).

CLINICAL DATA: Screening.

EXAM:
DIGITAL SCREENING BILATERAL MAMMOGRAM WITH TOMO AND CAD

[R CC synth-2D]
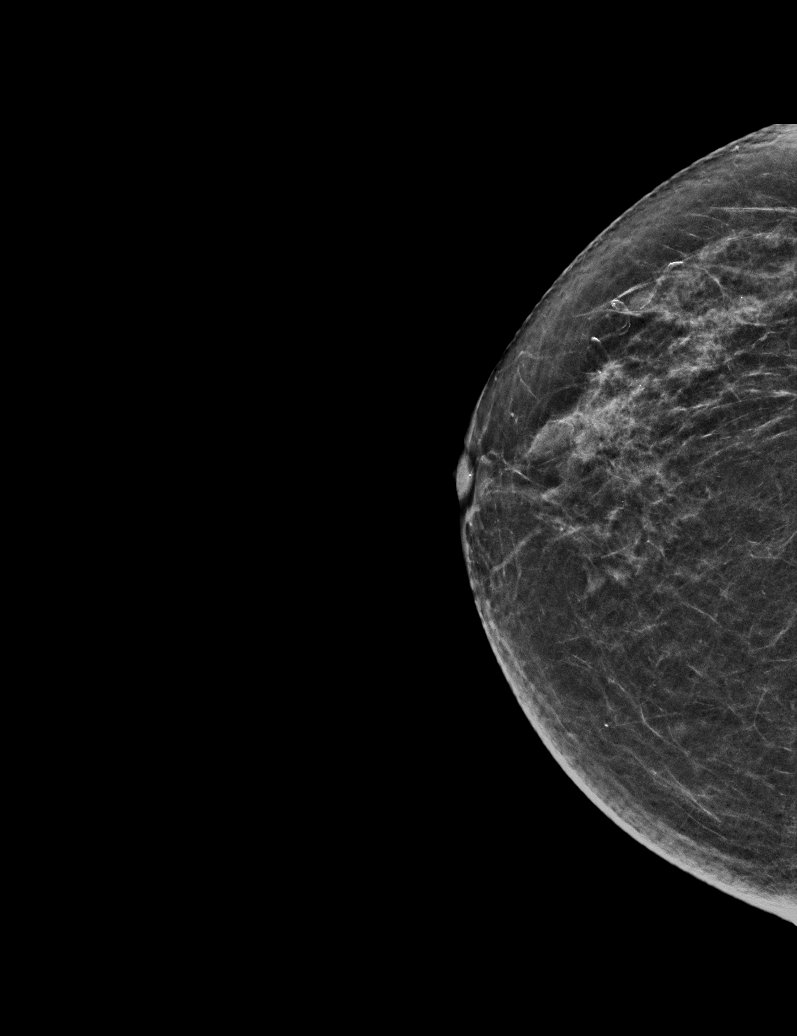

[R XCCL synth-2D (1 of 2)]
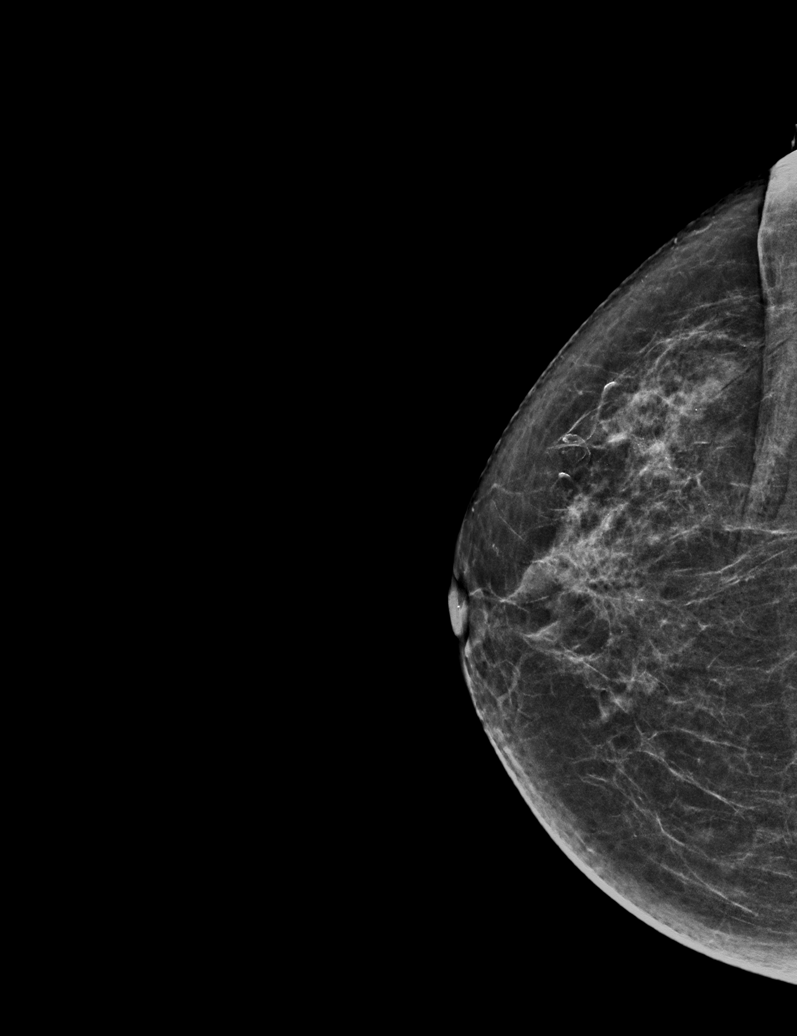

[L CC synth-2D]
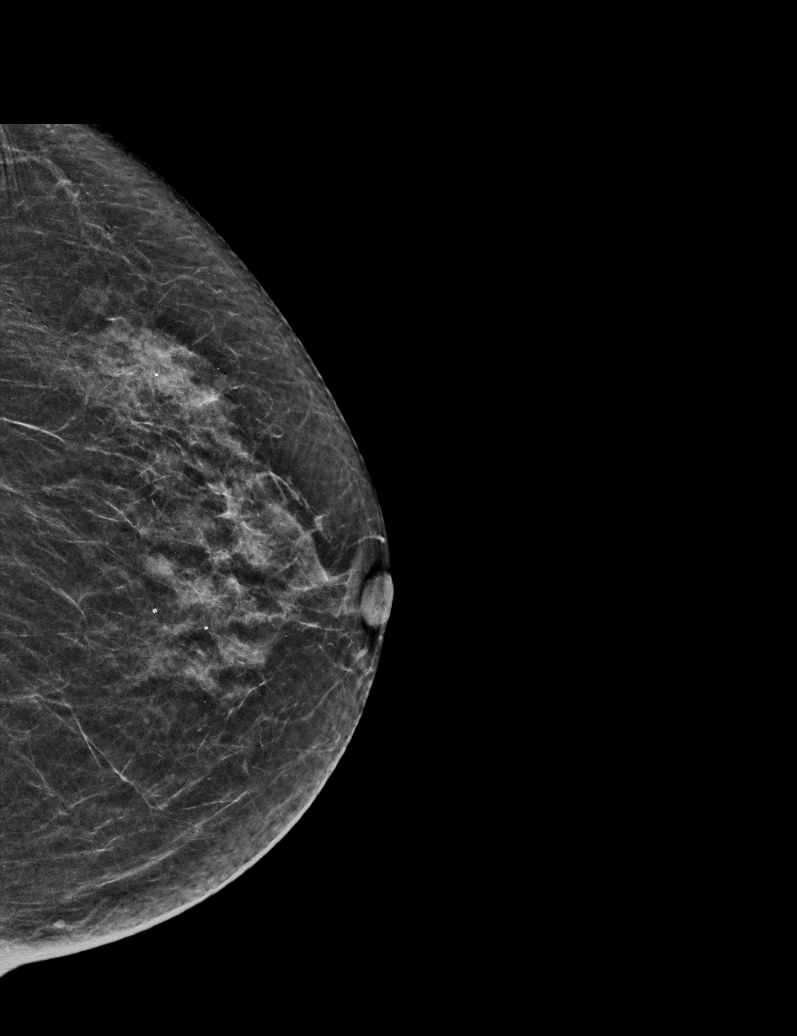

[R XCCL synth-2D (2 of 2)]
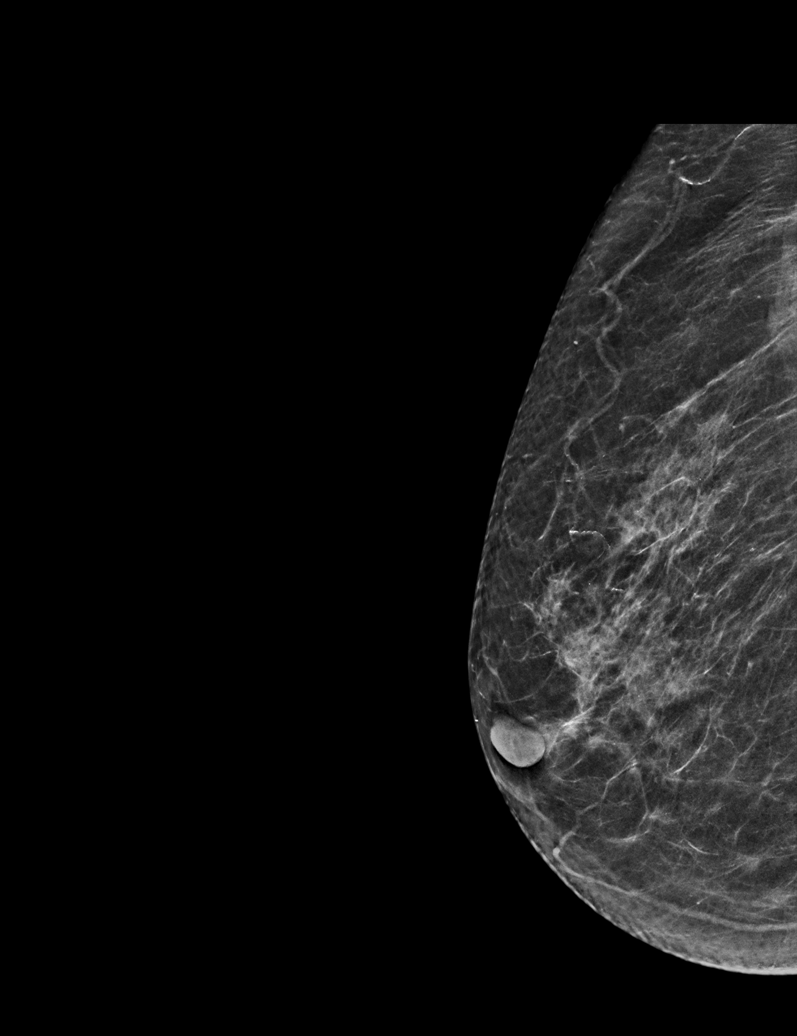

[R MLO synth-2D]
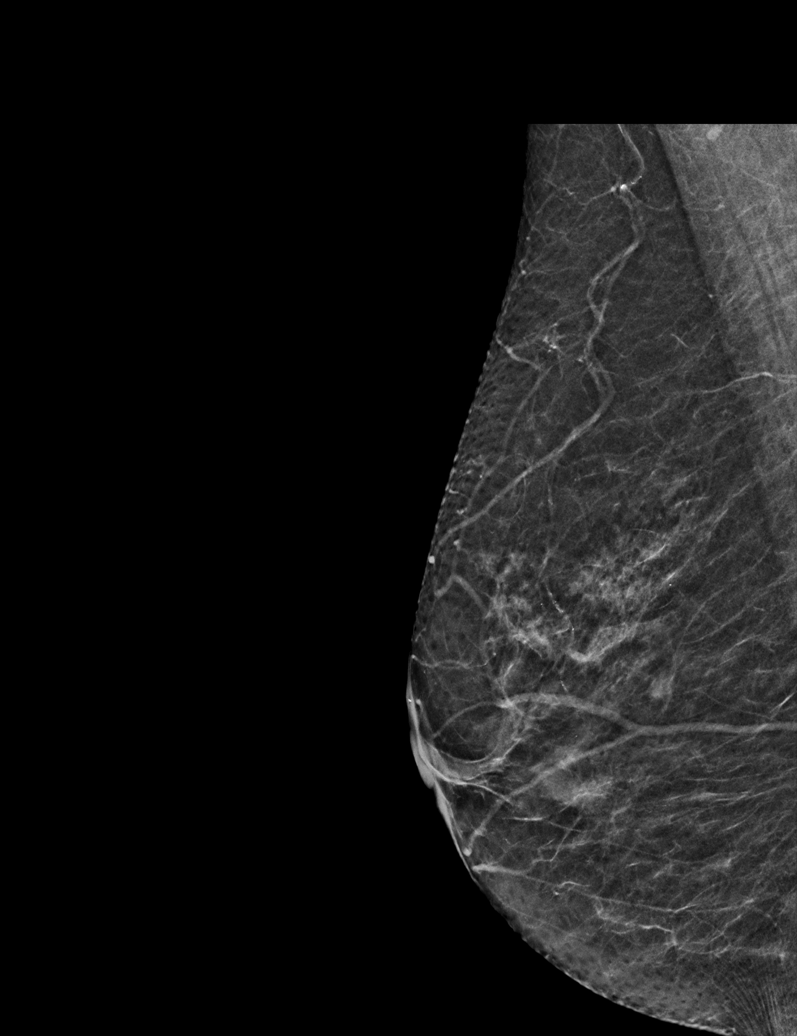

[L MLO synth-2D]
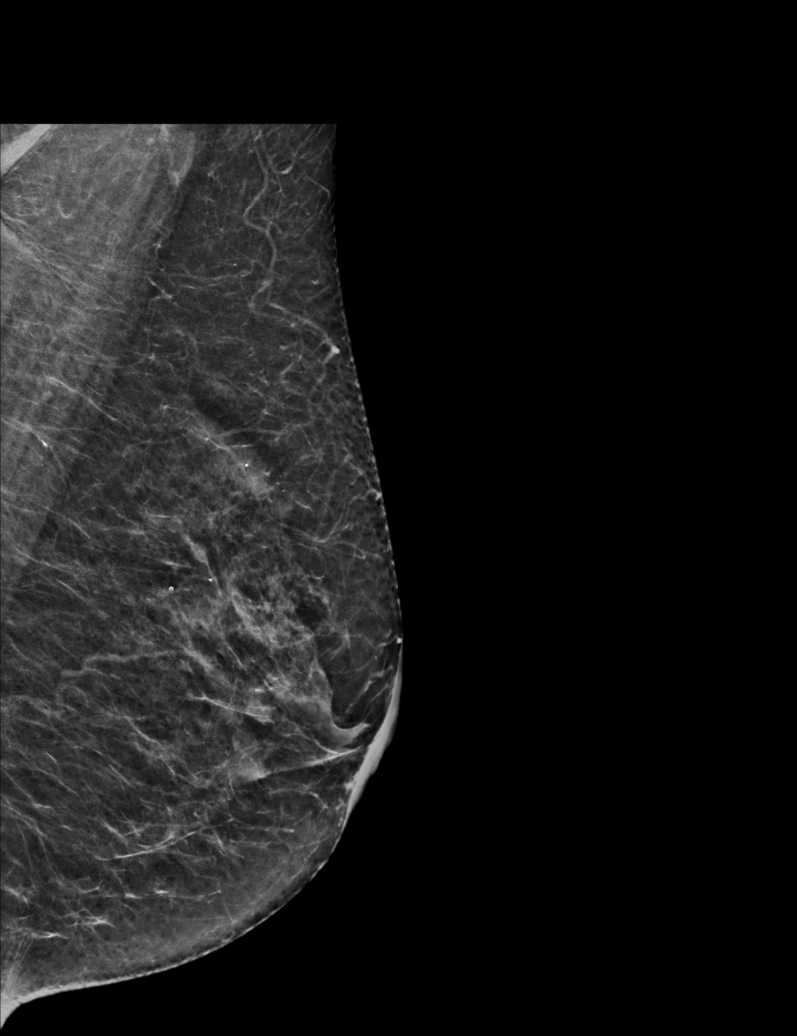

[6 of 36 positions shown; findings below may reference images not displayed]

ACR Breast Density Category b: There are scattered areas of
fibroglandular density.
FINDINGS: There are no findings suspicious for malignancy. Images were
processed with CAD.
IMPRESSION: No mammographic evidence of malignancy. A result letter of this
screening mammogram will be mailed directly to the patient.

RECOMMENDATION:
Screening mammogram in one year. (Code:[id number])

BI-RADS CATEGORY  1: Negative.

## 2021-11-22 ENCOUNTER — Other Ambulatory Visit (HOSPITAL_BASED_OUTPATIENT_CLINIC_OR_DEPARTMENT_OTHER): Payer: Self-pay

## 2021-11-22 ENCOUNTER — Other Ambulatory Visit: Payer: Self-pay | Admitting: Family Medicine

## 2021-11-22 MED ORDER — METFORMIN HCL 1000 MG PO TABS
ORAL_TABLET | Freq: Two times a day (BID) | ORAL | 1 refills | Status: DC
Start: 2021-11-22 — End: 2022-05-05
  Filled 2021-11-22: qty 180, 90d supply, fill #0
  Filled 2022-02-21: qty 180, 90d supply, fill #1

## 2021-11-22 MED ORDER — CHLORTHALIDONE 25 MG PO TABS
ORAL_TABLET | Freq: Every day | ORAL | 1 refills | Status: DC
  Filled 2021-11-22: qty 90, 90d supply, fill #0
  Filled 2022-02-21: qty 90, 90d supply, fill #1

## 2021-12-17 ENCOUNTER — Other Ambulatory Visit (HOSPITAL_BASED_OUTPATIENT_CLINIC_OR_DEPARTMENT_OTHER): Payer: Self-pay

## 2022-01-01 ENCOUNTER — Telehealth: Payer: Self-pay | Admitting: *Deleted

## 2022-01-01 ENCOUNTER — Other Ambulatory Visit (INDEPENDENT_AMBULATORY_CARE_PROVIDER_SITE_OTHER): Payer: Medicare Other

## 2022-01-01 DIAGNOSIS — Z0000 Encounter for general adult medical examination without abnormal findings: Secondary | ICD-10-CM | POA: Diagnosis not present

## 2022-01-01 DIAGNOSIS — I10 Essential (primary) hypertension: Secondary | ICD-10-CM | POA: Diagnosis not present

## 2022-01-01 DIAGNOSIS — E1165 Type 2 diabetes mellitus with hyperglycemia: Secondary | ICD-10-CM

## 2022-01-01 LAB — CBC
HCT: 38.9 % (ref 36.0–46.0)
Hemoglobin: 13 g/dL (ref 12.0–15.0)
MCHC: 33.4 g/dL (ref 30.0–36.0)
MCV: 94.8 fl (ref 78.0–100.0)
Platelets: 198 10*3/uL (ref 150.0–400.0)
RBC: 4.1 Mil/uL (ref 3.87–5.11)
RDW: 13.1 % (ref 11.5–15.5)
WBC: 5 10*3/uL (ref 4.0–10.5)

## 2022-01-01 LAB — COMPREHENSIVE METABOLIC PANEL
ALT: 23 U/L (ref 0–35)
AST: 21 U/L (ref 0–37)
Albumin: 4.4 g/dL (ref 3.5–5.2)
Alkaline Phosphatase: 50 U/L (ref 39–117)
BUN: 17 mg/dL (ref 6–23)
CO2: 29 mEq/L (ref 19–32)
Calcium: 9.4 mg/dL (ref 8.4–10.5)
Chloride: 98 mEq/L (ref 96–112)
Creatinine, Ser: 0.62 mg/dL (ref 0.40–1.20)
GFR: 87.94 mL/min (ref >60.00)
Glucose, Bld: 115 mg/dL — ABNORMAL HIGH (ref 70–99)
Potassium: 3.8 mEq/L (ref 3.5–5.1)
Sodium: 137 mEq/L (ref 135–145)
Total Bilirubin: 0.4 mg/dL (ref 0.2–1.2)
Total Protein: 6.7 g/dL (ref 6.0–8.3)

## 2022-01-01 LAB — MICROALBUMIN / CREATININE URINE RATIO
Creatinine,U: 35.7 mg/dL
Microalb Creat Ratio: 2 mg/g (ref 0.0–30.0)
Microalb, Ur: <0.7 mg/dL (ref 0.0–1.9)

## 2022-01-01 LAB — HEMOGLOBIN A1C: Hgb A1c MFr Bld: 7.7 % — ABNORMAL HIGH (ref 4.6–6.5)

## 2022-01-01 LAB — LIPID PANEL
Cholesterol: 108 mg/dL (ref 0–200)
HDL: 47.4 mg/dL (ref >39.00)
LDL Cholesterol: 44 mg/dL (ref 0–99)
NonHDL: 60.1
Total CHOL/HDL Ratio: 2
Triglycerides: 79 mg/dL (ref 0.0–149.0)
VLDL: 15.8 mg/dL (ref 0.0–40.0)

## 2022-01-01 NOTE — Telephone Encounter (Signed)
Pt in office for lab visit. Offered to schedule AWV per scheduling note.  Pt declines at this time due to sick spouse and lack of availability for appointment.  Encouraged pt to call back and schedule when situation improves.

## 2022-01-06 ENCOUNTER — Telehealth: Payer: Self-pay | Admitting: Family Medicine

## 2022-01-06 ENCOUNTER — Ambulatory Visit (INDEPENDENT_AMBULATORY_CARE_PROVIDER_SITE_OTHER): Payer: Medicare Other | Admitting: Family Medicine

## 2022-01-06 ENCOUNTER — Encounter: Payer: Self-pay | Admitting: Family Medicine

## 2022-01-06 VITALS — BP 121/78 | HR 61 | Temp 98.3°F | Ht 62.0 in | Wt 112.1 lb

## 2022-01-06 DIAGNOSIS — E1165 Type 2 diabetes mellitus with hyperglycemia: Secondary | ICD-10-CM | POA: Diagnosis not present

## 2022-01-06 DIAGNOSIS — G4700 Insomnia, unspecified: Secondary | ICD-10-CM

## 2022-01-06 DIAGNOSIS — G44209 Tension-type headache, unspecified, not intractable: Secondary | ICD-10-CM | POA: Diagnosis not present

## 2022-01-06 DIAGNOSIS — H1131 Conjunctival hemorrhage, right eye: Secondary | ICD-10-CM | POA: Diagnosis not present

## 2022-01-06 DIAGNOSIS — Z0000 Encounter for general adult medical examination without abnormal findings: Secondary | ICD-10-CM

## 2022-01-06 MED ORDER — METHYLPREDNISOLONE ACETATE 80 MG/ML IJ SUSP
80.0000 mg | Freq: Once | INTRAMUSCULAR | Status: AC
  Administered 2022-01-06: 80 mg via INTRAMUSCULAR

## 2022-01-06 NOTE — Telephone Encounter (Signed)
Crystal with [facility] calling to inform us they cannot see patient because they do not accept her insurance. Patient will need to be referred to another office.

## 2022-01-06 NOTE — Telephone Encounter (Signed)
[facility] called stating that they do not take [facility] Medicare and the referral for this pt will have to be rerouted elsewhere.

## 2022-01-06 NOTE — Telephone Encounter (Signed)
Sent a message to our patient care coordinator

## 2022-01-06 NOTE — Patient Instructions (Addendum)
Keep the diet clean and stay active.  I recommend getting the flu shot in mid October. This suggestion would change if the CDC comes out with a different recommendation.   If you do not hear anything about your referral in the next 1-2 weeks, call our office and ask for an update.  Sleep is important to [place] all. Getting good sleep is imperative to adequate functioning during the day. Work with our counselors who are trained to help people obtain quality sleep. Call [phone] to schedule an appointment or if you are curious about insurance coverage/cost.  Don't worry about your eye.   Sleep Hygiene Tips: Do not watch TV or look at screens within 1 hour of going to bed. If you do, make sure there is a blue light filter (nighttime mode) involved. Try to go to bed around the same time every night. Wake up at the same time within 1 hour of regular time. Ex: If you wake up at 7 AM for work, do not sleep past 8 AM on days that you don't work. Do not drink alcohol before bedtime. Do not consume caffeine-containing beverages after noon or within 9 hours of intended bedtime. Get regular exercise/physical activity in your life, but not within 2 hours of planned bedtime. Do not take naps.  Do not eat within 2 hours of planned bedtime. Melatonin, 3-5 mg 30-60 minutes before planned bedtime may be helpful.  The bed should be for sleep or sex only. If after 20-30 minutes you are unable to fall asleep, get up and do something relaxing. Do this until you feel ready to go to sleep again.    Foods that may reduce pain: 1) Ginger 2) Blueberries 3) Salmon 4) Pumpkin seeds 5) dark chocolate 6) turmeric 7) tart cherries 8) virgin olive oil 9) chilli peppers 10) mint 11) krill oil  Let us know if you need anything.

## 2022-01-06 NOTE — Addendum Note (Signed)
Addended by: Sharon Seller B on: 01/06/2022 10:50 AM   Modules accepted: Orders

## 2022-01-06 NOTE — Progress Notes (Signed)
Chief Complaint  Patient presents with   Annual Exam    Problems with sleep/going to sleep and waking up frequently. Left leg at night pain. Stopped trazodone Right eye redness Headache Corn on bottom of right foot     Well Woman Dawn Schwartz is here for a complete physical.  Here with the aid of [place] video interpreter. Her last physical was >1 year ago.  Current diet: in general, a "healthy" diet. Current exercise: active at home and in yard. Weight is stable and she denies daytime fatigue. Seatbelt? Yes  Health Maintenance Colonoscopy- Yes Shingrix- Yes DEXA- Yes Mammogram- Yes Tetanus- Yes Pneumonia- Yes Hep C screen- Yes  Insomnia- Wakes up 3-4 times during the night while sleeping. Has to use restroom sometimes. Does not nap during day. Tried Trazodone which did not help. No racing thoughts or hx of anxiety/depression. Is not following w counselor/therapist. Has not tried any other rx meds.   Headache The patient had a headache for 2 days, bilateral, no neurologic signs or symptoms.  She tried taking Tylenol with minimal relief.  No history of migraines or chronic headache issues.  Past Medical History:  Diagnosis Date   Aortic atherosclerosis ([place])    Diabetes mellitus type 2 in nonobese [facility])    Essential hypertension    History of Helicobacter pylori infection    Hypercholesterolemia    Weight loss      Past Surgical History:  Procedure Laterality Date   NO PAST SURGERIES      Medications  Current Outpatient Medications on File Prior to Visit  Medication Sig Dispense Refill   Alcohol Swabs (B-D SINGLE USE SWABS REGULAR) PADS Use daily to check blood sugar.  DX E11.9 100 each 6   aspirin EC 81 MG tablet Take 81 mg by mouth daily.     atorvastatin (LIPITOR) 40 MG tablet TAKE 1 TABLET BY MOUTH ONCE DAILY 90 tablet 3   Blood Glucose Monitoring Suppl (ONE TOUCH ULTRA 2) w/Device KIT Use once daily to check blood sugar.  DX E11.9 1 kit 0   chlorthalidone  (HYGROTON) 25 MG tablet TAKE 1 TABLET (25 MG TOTAL) BY MOUTH DAILY. 90 tablet 1   COVID-19 mRNA bivalent vaccine, Pfizer, (PFIZER COVID-19 VAC BIVALENT) injection Inject into the muscle. 0.3 mL 0   glucose blood (ONETOUCH ULTRA) test strip Use once daily to check blood sugar 100 each 12   influenza vaccine adjuvanted (FLUAD) 0.5 ML injection Inject into the muscle. 0.5 mL 0   Lancets (ONETOUCH ULTRASOFT) lancets Use once daily to check blood sugar. 100 each 12   lidocaine (XYLOCAINE) 2 % solution Gargle/rinse with 5-10 mls by mouth every 6 hours as needed for pain over tongue. 100 mL 0   losartan (COZAAR) 100 MG tablet TAKE 1 TABLET (100 MG TOTAL) BY MOUTH DAILY. 90 tablet 1   metFORMIN (GLUCOPHAGE) 1000 MG tablet TAKE 1 TABLET (1,000 MG TOTAL) BY MOUTH 2 (TWO) TIMES DAILY WITH A MEAL. 180 tablet 1   Multiple Vitamin (MULTIVITAMIN) tablet Take 1 tablet by mouth daily.     pantoprazole (PROTONIX) 40 MG tablet Take 1 tablet (40 mg total) by mouth daily. 90 tablet 2   terbinafine (LAMISIL) 250 MG tablet TAKE 1 TABLET BY MOUTH ONCE DAILY 90 tablet 2   levocetirizine (XYZAL) 5 MG tablet Take 1 tablet (5 mg total) by mouth every evening. 90 tablet 2    Allergies No Known Allergies  Review of Systems: Constitutional:  no fevers Eye:  no  recent significant change in vision Ears:  No changes in hearing Nose/Mouth/Throat:  no complaints of nasal congestion, no sore throat Cardiovascular: no chest pain Respiratory:  No shortness of breath Gastrointestinal:  No change in bowel habits GU:  Female: negative for dysuria Integumentary:  +corn on R foot Neurologic:  + headaches Endocrine:  denies unexplained weight changes  Exam BP 121/78   Pulse 61   Temp 98.3 F (36.8 C) (Oral)   Ht _0  (1.575 m)   Wt 112 lb 2 oz (50.9 kg)   SpO2 98%   BMI 20.51 kg/m  General:  well developed, well nourished, in no apparent distress Skin:  no significant moles, warts, or growths Head:  no masses,  lesions, or tenderness Eyes:  pupils equal and round, sclera anicteric without injection, bleeding noted on the right lateral globe/sclera Ears:  canals without lesions, TMs shiny without retraction, no obvious effusion, no erythema Nose:  nares patent, septum midline, mucosa normal, and no drainage or sinus tenderness Throat/Pharynx:  lips and gingiva without lesion; tongue and uvula midline; non-inflamed pharynx; no exudates or postnasal drainage Neck: neck supple without adenopathy, thyromegaly, or masses Lungs:  clear to auscultation, breath sounds equal bilaterally, no respiratory distress Cardio:  regular rate and rhythm, no bruits or LE edema Abdomen:  abdomen soft, nontender; bowel sounds normal; no masses or organomegaly Genital: Deferred Neuro:  gait normal; deep tendon reflexes normal and symmetric Psych: well oriented with normal range of affect and appropriate judgment/insight  Assessment and Plan  Well adult exam  Type 2 diabetes mellitus with hyperglycemia, without long-term current use of insulin ([place]) - Plan: Ambulatory referral to Ophthalmology, Comprehensive metabolic panel, Lipid panel, Hemoglobin A1c  Insomnia, unspecified type  Subconjunctival hemorrhage of right eye  Acute non intractable tension-type headache   Well 76 y.o. female. Counseled on diet and exercise. Advanced directive form provided today.  Insomnia: Chronic, uncontrolled. Stop trazodone. Politely declined any other pharmacotherapy at this time. Counseling info provided. Headache: Acute-no red flag signs or symptoms on exam or in history. Cont Tylenol. Depo IM injection.  Other orders as above. Follow-up in 6 months or as needed. The patient, through the interpreter, voiced understanding and agreement to the plan.  [place], DO 01/06/22 10:35 AM

## 2022-01-21 ENCOUNTER — Other Ambulatory Visit: Payer: Self-pay | Admitting: Family Medicine

## 2022-01-21 ENCOUNTER — Other Ambulatory Visit (HOSPITAL_BASED_OUTPATIENT_CLINIC_OR_DEPARTMENT_OTHER): Payer: Self-pay

## 2022-01-21 MED ORDER — ATORVASTATIN CALCIUM 40 MG PO TABS
40.0000 mg | ORAL_TABLET | Freq: Every day | ORAL | 3 refills | Status: DC
Start: 2022-01-21 — End: 2023-01-19
  Filled 2022-01-21: qty 90, 90d supply, fill #0
  Filled 2022-04-22: qty 90, 90d supply, fill #1
  Filled 2022-07-29: qty 90, 90d supply, fill #2
  Filled 2022-10-21: qty 90, 90d supply, fill #3

## 2022-01-27 ENCOUNTER — Ambulatory Visit (INDEPENDENT_AMBULATORY_CARE_PROVIDER_SITE_OTHER): Payer: Medicare Other

## 2022-01-27 ENCOUNTER — Encounter: Payer: Self-pay | Admitting: Family Medicine

## 2022-01-27 DIAGNOSIS — Z23 Encounter for immunization: Secondary | ICD-10-CM | POA: Diagnosis not present

## 2022-02-21 ENCOUNTER — Other Ambulatory Visit (HOSPITAL_BASED_OUTPATIENT_CLINIC_OR_DEPARTMENT_OTHER): Payer: Self-pay

## 2022-03-17 ENCOUNTER — Other Ambulatory Visit (HOSPITAL_BASED_OUTPATIENT_CLINIC_OR_DEPARTMENT_OTHER): Payer: Self-pay

## 2022-04-03 ENCOUNTER — Other Ambulatory Visit: Payer: Self-pay | Admitting: Family Medicine

## 2022-04-03 ENCOUNTER — Other Ambulatory Visit (HOSPITAL_BASED_OUTPATIENT_CLINIC_OR_DEPARTMENT_OTHER): Payer: Self-pay

## 2022-04-03 MED ORDER — ONETOUCH ULTRA VI STRP
ORAL_STRIP | 12 refills | Status: DC
  Filled 2022-04-03: qty 100, 90d supply, fill #0
  Filled 2022-12-11: qty 100, 90d supply, fill #1

## 2022-04-04 ENCOUNTER — Other Ambulatory Visit (HOSPITAL_BASED_OUTPATIENT_CLINIC_OR_DEPARTMENT_OTHER): Payer: Self-pay

## 2022-04-22 ENCOUNTER — Other Ambulatory Visit (HOSPITAL_BASED_OUTPATIENT_CLINIC_OR_DEPARTMENT_OTHER): Payer: Self-pay

## 2022-04-29 ENCOUNTER — Telehealth: Payer: Self-pay | Admitting: *Deleted

## 2022-04-29 ENCOUNTER — Encounter: Payer: Self-pay | Admitting: Family Medicine

## 2022-04-29 ENCOUNTER — Other Ambulatory Visit (HOSPITAL_BASED_OUTPATIENT_CLINIC_OR_DEPARTMENT_OTHER): Payer: Self-pay

## 2022-04-29 ENCOUNTER — Ambulatory Visit (INDEPENDENT_AMBULATORY_CARE_PROVIDER_SITE_OTHER): Payer: Medicare Other | Admitting: Family Medicine

## 2022-04-29 VITALS — BP 130/80 | HR 72 | Temp 97.5°F | Resp 18 | Ht 62.0 in | Wt 108.8 lb

## 2022-04-29 DIAGNOSIS — K529 Noninfective gastroenteritis and colitis, unspecified: Secondary | ICD-10-CM

## 2022-04-29 DIAGNOSIS — K30 Functional dyspepsia: Secondary | ICD-10-CM

## 2022-04-29 LAB — CBC WITH DIFFERENTIAL/PLATELET
Basophils Absolute: 0 10*3/uL (ref 0.0–0.1)
Basophils Relative: 0.4 % (ref 0.0–3.0)
Eosinophils Absolute: 0 10*3/uL (ref 0.0–0.7)
Eosinophils Relative: 0.3 % (ref 0.0–5.0)
HCT: 43 % (ref 36.0–46.0)
Hemoglobin: 15.2 g/dL — ABNORMAL HIGH (ref 12.0–15.0)
Lymphocytes Relative: 44.8 % (ref 12.0–46.0)
Lymphs Abs: 1.8 10*3/uL (ref 0.7–4.0)
MCHC: 35.4 g/dL (ref 30.0–36.0)
MCV: 89.8 fl (ref 78.0–100.0)
Monocytes Absolute: 0.5 10*3/uL (ref 0.1–1.0)
Monocytes Relative: 12.7 % — ABNORMAL HIGH (ref 3.0–12.0)
Neutro Abs: 1.7 10*3/uL (ref 1.4–7.7)
Neutrophils Relative %: 41.8 % — ABNORMAL LOW (ref 43.0–77.0)
Platelets: 327 10*3/uL (ref 150.0–400.0)
RBC: 4.78 Mil/uL (ref 3.87–5.11)
RDW: 12.4 % (ref 11.5–15.5)
WBC: 4.1 10*3/uL (ref 4.0–10.5)

## 2022-04-29 LAB — COMPREHENSIVE METABOLIC PANEL
ALT: 27 U/L (ref 0–35)
AST: 22 U/L (ref 0–37)
Albumin: 4.8 g/dL (ref 3.5–5.2)
Alkaline Phosphatase: 62 U/L (ref 39–117)
BUN: 7 mg/dL (ref 6–23)
CO2: 25 mEq/L (ref 19–32)
Calcium: 9.6 mg/dL (ref 8.4–10.5)
Chloride: 83 mEq/L — ABNORMAL LOW (ref 96–112)
Creatinine, Ser: 0.55 mg/dL (ref 0.40–1.20)
GFR: 90.31 mL/min (ref >60.00)
Glucose, Bld: 122 mg/dL — ABNORMAL HIGH (ref 70–99)
Potassium: 3.7 mEq/L (ref 3.5–5.1)
Sodium: 123 mEq/L — ABNORMAL LOW (ref 135–145)
Total Bilirubin: 0.9 mg/dL (ref 0.2–1.2)
Total Protein: 7 g/dL (ref 6.0–8.3)

## 2022-04-29 LAB — LIPASE: Lipase: 32 U/L (ref 11.0–59.0)

## 2022-04-29 LAB — AMYLASE: Amylase: 33 U/L (ref 27–131)

## 2022-04-29 MED ORDER — PROMETHAZINE HCL 25 MG/ML IJ SOLN
50.0000 mg | Freq: Once | INTRAMUSCULAR | Status: AC
  Administered 2022-04-29: 50 mg via INTRAVENOUS

## 2022-04-29 MED ORDER — ONDANSETRON HCL 4 MG PO TABS
4.0000 mg | ORAL_TABLET | Freq: Three times a day (TID) | ORAL | 0 refills | Status: DC | PRN
  Filled 2022-04-29: qty 20, 7d supply, fill #0

## 2022-04-29 NOTE — Patient Instructions (Addendum)
If the vomiting cont , go to ER     Food Choices to Help Relieve Diarrhea, Adult Diarrhea can make you feel weak and cause you to become dehydrated. Dehydration is a condition in which there is not enough water or other fluids in the body. It is important to choose the right foods and drinks to: Relieve diarrhea. Replace lost fluids and nutrients. Prevent dehydration. What are tips for following this plan? Relieving diarrhea Avoid foods that make your diarrhea worse. These may include: Foods and drinks that are sweetened with high-fructose corn syrup, honey, or sweeteners such as xylitol, sorbitol, and mannitol. Check food labels for these ingredients. Fried, greasy, or spicy foods. Raw fruits and vegetables. Eat foods that are rich in probiotics. These include foods such as yogurt and fermented milk products. Probiotics can help increase healthy bacteria in your stomach and intestines (gastrointestinal or GI tract). This may help digestion and stop diarrhea. If you have lactose intolerance, avoid dairy products. These may make your diarrhea worse. Take medicine to help stop diarrhea only as told by your health care provider. Replacing nutrients  Eat bland, easy-to-digest foods in small amounts as you are able, until your diarrhea starts to get better. These foods include bananas, applesauce, rice, toast, and crackers. Over time, add nutrient-rich foods as your body tolerates them or as told by your health care provider. These include: Well-cooked protein foods, such as eggs, lean meats like fish or chicken without skin, and tofu. Peeled, seeded, and soft-cooked fruits and vegetables. Low-fat dairy products. Whole grains. Take vitamin and mineral supplements as told by your health care provider. Preventing dehydration  Start by sipping water or a solution to prevent dehydration (oral rehydration solution, or ORS). This is a drink that helps replace fluids and minerals your body has lost.  You can buy an ORS at pharmacies and retail stores. Try to drink at least 8-10 cups (2,000-2,500 mL) of fluid each day to help replace lost fluids. If your urine is pale yellow, you are getting enough fluids. You may drink other liquids in addition to water, such as fruit juice that you have added water to (diluted fruit juice) or low-calorie sports drinks, as tolerated or as told by your health care provider. Avoid drinks with caffeine, such as coffee, tea, or soft drinks. Avoid alcohol. This information is not intended to replace advice given to you by your health care provider. Make sure you discuss any questions you have with your health care provider. Document Revised: 10/01/2021 Document Reviewed: 10/01/2021 Elsevier Patient Education  [employer].

## 2022-04-29 NOTE — Assessment & Plan Note (Signed)
Gatorade/ pedialyte  Phenergan I'm Zofran sent in  If nausea / vomiting cont despite phenergan ---  go to ER

## 2022-04-29 NOTE — Assessment & Plan Note (Signed)
On protonix

## 2022-04-29 NOTE — Progress Notes (Signed)
Subjective:   By signing my name below, I, Eugene Gavia, attest that this documentation has been prepared under the direction and in the presence of Ann Held, DO. 04/29/22    Patient ID: Dawn Schwartz, female    DOB: [birth year], 77 y.o.   MRN: [id number]  Chief Complaint  Patient presents with   Emesis   Diarrhea    Pt states having sxs last week. Pt states nausea/diarrhea started last week. Pt states having dizziness and headache. Pt states throwing up water. Pt states last urine was this morning. Pt states drinking Pedialyte     HPI Patient is in today for complaints of diarrhea and vomiting x1 week. This is a patient of Dr. Nani Ravens. History provided via [place] tele-interpreter today. She reports nausea and vomiting x1 week. She notes accompanying watery diarrhea, dizziness, headaches, and epigastric abdominal pain. She states that she has not been eating much due to her symptoms. She only ate a half a cup of rice porridge yesterday but vomited afterward. She has not eaten anything since. She has been drinking water, Gatorade, and Pedialyte. She feels nauseated even with drinking small amounts of these fluids. Pedialyte is the only liquid she has been able to keep down. She denies any fevers, dysuria, hematuria, bloody stools. She denies any known sick contacts. Nobody else in her home has similar symptoms.  Past Medical History:  Diagnosis Date   Aortic atherosclerosis ([place])    Diabetes mellitus type 2 in nonobese ([place])    Essential hypertension    History of Helicobacter pylori infection    Hypercholesterolemia    Weight loss     Past Surgical History:  Procedure Laterality Date   NO PAST SURGERIES      Family History  Problem Relation Age of Onset   Hypertension Mother    Other Father        old age   Cancer Neg Hx    Heart disease Neg Hx    Colon cancer Neg Hx    Liver cancer Neg Hx    Stomach cancer Neg Hx    Rectal cancer Neg Hx    Esophageal  cancer Neg Hx     Social History   Socioeconomic History   Marital status: Married    Spouse name: Not on file   Number of children: 2   Years of education: Not on file   Highest education level: Not on file  Occupational History   Occupation: retired  Tobacco Use   Smoking status: Never   Smokeless tobacco: Never  Substance and Sexual Activity   Alcohol use: Never   Drug use: Never   Sexual activity: Not on file  Other Topics Concern   Not on file  Social History Narrative   Not on file   Social Determinants of Health   Financial Resource Strain: Not on file  Food Insecurity: Not on file  Transportation Needs: Not on file  Physical Activity: Not on file  Stress: Not on file  Social Connections: Not on file  Intimate Partner Violence: Not on file    Outpatient Medications Prior to Visit  Medication Sig Dispense Refill   Alcohol Swabs (B-D SINGLE USE SWABS REGULAR) PADS Use daily to check blood sugar.  DX E11.9 100 each 6   aspirin EC 81 MG tablet Take 81 mg by mouth daily.     atorvastatin (LIPITOR) 40 MG tablet TAKE 1 TABLET BY MOUTH ONCE DAILY 90 tablet 3   Blood  Glucose Monitoring Suppl (ONE TOUCH ULTRA 2) w/Device KIT Use once daily to check blood sugar.  DX E11.9 1 kit 0   chlorthalidone (HYGROTON) 25 MG tablet TAKE 1 TABLET (25 MG TOTAL) BY MOUTH DAILY. 90 tablet 1   COVID-19 mRNA bivalent vaccine, Pfizer, (PFIZER COVID-19 VAC BIVALENT) injection Inject into the muscle. 0.3 mL 0   glucose blood (ONETOUCH ULTRA) test strip Use once daily to check blood sugar 100 each 12   influenza vaccine adjuvanted (FLUAD) 0.5 ML injection Inject into the muscle. 0.5 mL 0   Lancets (ONETOUCH ULTRASOFT) lancets Use once daily to check blood sugar. 100 each 12   lidocaine (XYLOCAINE) 2 % solution Gargle/rinse with 5-10 mls by mouth every 6 hours as needed for pain over tongue. 100 mL 0   losartan (COZAAR) 100 MG tablet TAKE 1 TABLET (100 MG TOTAL) BY MOUTH DAILY. 90 tablet 1    metFORMIN (GLUCOPHAGE) 1000 MG tablet TAKE 1 TABLET (1,000 MG TOTAL) BY MOUTH 2 (TWO) TIMES DAILY WITH A MEAL. 180 tablet 1   Multiple Vitamin (MULTIVITAMIN) tablet Take 1 tablet by mouth daily.     pantoprazole (PROTONIX) 40 MG tablet Take 1 tablet (40 mg total) by mouth daily. 90 tablet 2   terbinafine (LAMISIL) 250 MG tablet TAKE 1 TABLET BY MOUTH ONCE DAILY 90 tablet 2   levocetirizine (XYZAL) 5 MG tablet Take 1 tablet (5 mg total) by mouth every evening. 90 tablet 2   No facility-administered medications prior to visit.    No Known Allergies  Review of Systems  Constitutional:  Negative for fever and malaise/fatigue.  HENT:  Negative for congestion.   Eyes:  Negative for blurred vision.  Respiratory:  Negative for cough and shortness of breath.   Cardiovascular:  Negative for chest pain, palpitations and leg swelling.  Gastrointestinal:  Positive for abdominal pain, diarrhea, nausea and vomiting. Negative for blood in stool and melena.  Genitourinary:  Negative for dysuria and hematuria.  Musculoskeletal:  Negative for back pain.  Skin:  Negative for rash.  Neurological:  Positive for dizziness and headaches. Negative for loss of consciousness.       Objective:    Physical Exam Vitals and nursing note reviewed.  Constitutional:      Appearance: She is well-developed. She is ill-appearing.  HENT:     Head: Normocephalic and atraumatic.  Eyes:     Conjunctiva/sclera: Conjunctivae normal.  Neck:     Thyroid: No thyromegaly.     Vascular: No carotid bruit or JVD.  Cardiovascular:     Rate and Rhythm: Normal rate and regular rhythm.     Heart sounds: Normal heart sounds. No murmur heard. Pulmonary:     Effort: Pulmonary effort is normal. No respiratory distress.     Breath sounds: Normal breath sounds. No wheezing or rales.  Chest:     Chest wall: No tenderness.  Abdominal:     General: Abdomen is flat.     Palpations: Abdomen is soft.     Tenderness: There is  abdominal tenderness in the epigastric area. There is no guarding or rebound.     Comments: Mild tenderness midepigastric   Musculoskeletal:     Cervical back: Normal range of motion and neck supple.  Neurological:     Mental Status: She is alert and oriented to person, place, and time.    BP 130/80 (BP Location: Right Arm, Patient Position: Sitting, Cuff Size: Normal)   Pulse 72   Temp (!) 97.5 F (  36.4 C) (Oral)   Resp 18   Ht _0  (1.575 m)   Wt 108 lb 12.8 oz (49.4 kg)   SpO2 99%   BMI 19.90 kg/m  Wt Readings from Last 3 Encounters:  04/29/22 108 lb 12.8 oz (49.4 kg)  01/06/22 112 lb 2 oz (50.9 kg)  06/24/21 113 lb (51.3 kg)    Diabetic Foot Exam - Simple   No data filed    Lab Results  Component Value Date   WBC 4.1 04/29/2022   HGB 15.2 (H) 04/29/2022   HCT 43.0 04/29/2022   PLT 327.0 04/29/2022   GLUCOSE 122 (H) 04/29/2022   CHOL 108 01/01/2022   TRIG 79.0 01/01/2022   HDL 47.40 01/01/2022   LDLCALC 44 01/01/2022   ALT 27 04/29/2022   AST 22 04/29/2022   NA 123 (L) 04/29/2022   K 3.7 04/29/2022   CL 83 (L) 04/29/2022   CREATININE 0.55 04/29/2022   BUN 7 04/29/2022   CO2 25 04/29/2022   TSH 0.09 (L) 01/21/2018   HGBA1C 7.7 (H) 01/01/2022   MICROALBUR <0.7 01/01/2022    Lab Results  Component Value Date   TSH 0.09 (L) 01/21/2018   Lab Results  Component Value Date   WBC 4.1 04/29/2022   HGB 15.2 (H) 04/29/2022   HCT 43.0 04/29/2022   MCV 89.8 04/29/2022   PLT 327.0 04/29/2022   Lab Results  Component Value Date   NA 123 (L) 04/29/2022   K 3.7 04/29/2022   CO2 25 04/29/2022   GLUCOSE 122 (H) 04/29/2022   BUN 7 04/29/2022   CREATININE 0.55 04/29/2022   BILITOT 0.9 04/29/2022   ALKPHOS 62 04/29/2022   AST 22 04/29/2022   ALT 27 04/29/2022   PROT 7.0 04/29/2022   ALBUMIN 4.8 04/29/2022   CALCIUM 9.6 04/29/2022   GFR 90.31 04/29/2022   Lab Results  Component Value Date   CHOL 108 01/01/2022   Lab Results  Component Value Date    HDL 47.40 01/01/2022   Lab Results  Component Value Date   LDLCALC 44 01/01/2022   Lab Results  Component Value Date   TRIG 79.0 01/01/2022   Lab Results  Component Value Date   CHOLHDL 2 01/01/2022   Lab Results  Component Value Date   HGBA1C 7.7 (H) 01/01/2022       Assessment & Plan:   Problem List Items Addressed This Visit       Unprioritized   Acid indigestion    On protonix       Other Visit Diagnoses     Gastroenteritis    -  Primary   Relevant Medications   ondansetron (ZOFRAN) 4 MG tablet   promethazine (PHENERGAN) injection 50 mg (Completed)   Other Relevant Orders   CBC with Differential/Platelet (Completed)   Comprehensive metabolic panel (Completed)   Amylase (Completed)   Lipase (Completed)   Fecal occult blood, imunochemical   Stool culture   Clostridium difficile Toxin B, Qualitative, Real-Time PCR        Meds ordered this encounter  Medications   ondansetron (ZOFRAN) 4 MG tablet    Sig: Take 1 tablet (4 mg total) by mouth every 8 (eight) hours as needed for nausea or vomiting.    Dispense:  20 tablet    Refill:  0   promethazine (PHENERGAN) injection 50 mg    I, Ann Held, DO, personally preformed the services described in this documentation.  All medical record entries made by  the scribe were at my direction and in my presence.  I have reviewed the chart and discharge instructions (if applicable) and agree that the record reflects my personal performance and is accurate and complete. 04/29/22      Ann Held, DO

## 2022-04-29 NOTE — Telephone Encounter (Signed)
Not sure if patient got IFOB kit.  Did try and call Elberta Fortis (son) but no answer and no vm.

## 2022-05-01 NOTE — Addendum Note (Signed)
Addended by: Kem Boroughs D on: 05/01/2022 02:34 PM   Modules accepted: Orders

## 2022-05-02 ENCOUNTER — Other Ambulatory Visit: Payer: Medicare Other

## 2022-05-02 DIAGNOSIS — K529 Noninfective gastroenteritis and colitis, unspecified: Secondary | ICD-10-CM | POA: Diagnosis not present

## 2022-05-02 NOTE — Telephone Encounter (Signed)
Patient came in and picked up ifob kit

## 2022-05-05 ENCOUNTER — Other Ambulatory Visit: Payer: Self-pay | Admitting: Family Medicine

## 2022-05-05 ENCOUNTER — Other Ambulatory Visit (HOSPITAL_BASED_OUTPATIENT_CLINIC_OR_DEPARTMENT_OTHER): Payer: Self-pay

## 2022-05-05 ENCOUNTER — Other Ambulatory Visit (INDEPENDENT_AMBULATORY_CARE_PROVIDER_SITE_OTHER): Payer: Medicare Other

## 2022-05-05 DIAGNOSIS — K529 Noninfective gastroenteritis and colitis, unspecified: Secondary | ICD-10-CM

## 2022-05-05 LAB — FECAL OCCULT BLOOD, IMMUNOCHEMICAL: Fecal Occult Bld: NEGATIVE

## 2022-05-05 MED ORDER — METFORMIN HCL 1000 MG PO TABS
1000.0000 mg | ORAL_TABLET | Freq: Two times a day (BID) | ORAL | 1 refills | Status: DC
  Filled 2022-05-05: qty 180, 90d supply, fill #0
  Filled 2022-08-08: qty 180, 90d supply, fill #1

## 2022-05-05 MED ORDER — LOSARTAN POTASSIUM 100 MG PO TABS
100.0000 mg | ORAL_TABLET | Freq: Every day | ORAL | 1 refills | Status: DC
  Filled 2022-05-05: qty 90, 90d supply, fill #0
  Filled 2022-08-08: qty 90, 90d supply, fill #1

## 2022-05-06 ENCOUNTER — Encounter: Payer: Self-pay | Admitting: Family Medicine

## 2022-05-06 LAB — STOOL CULTURE: E coli, Shiga toxin Assay: NEGATIVE

## 2022-05-06 LAB — CLOSTRIDIUM DIFFICILE TOXIN B, QUALITATIVE, REAL-TIME PCR: Toxigenic C. Difficile by PCR: NOT DETECTED

## 2022-05-06 NOTE — Progress Notes (Signed)
Letter mailed out. Done 

## 2022-05-07 NOTE — Progress Notes (Signed)
Letter mailed out. Done 

## 2022-05-09 ENCOUNTER — Other Ambulatory Visit (HOSPITAL_BASED_OUTPATIENT_CLINIC_OR_DEPARTMENT_OTHER): Payer: Self-pay

## 2022-05-09 ENCOUNTER — Ambulatory Visit (INDEPENDENT_AMBULATORY_CARE_PROVIDER_SITE_OTHER): Payer: Medicare Other | Admitting: Family Medicine

## 2022-05-09 ENCOUNTER — Encounter: Payer: Self-pay | Admitting: Family Medicine

## 2022-05-09 VITALS — BP 132/80 | HR 97 | Temp 98.0°F | Resp 16 | Ht 60.0 in | Wt 114.0 lb

## 2022-05-09 DIAGNOSIS — K635 Polyp of colon: Secondary | ICD-10-CM

## 2022-05-09 DIAGNOSIS — E871 Hypo-osmolality and hyponatremia: Secondary | ICD-10-CM

## 2022-05-09 DIAGNOSIS — R1013 Epigastric pain: Secondary | ICD-10-CM | POA: Diagnosis not present

## 2022-05-09 DIAGNOSIS — E1165 Type 2 diabetes mellitus with hyperglycemia: Secondary | ICD-10-CM

## 2022-05-09 DIAGNOSIS — D582 Other hemoglobinopathies: Secondary | ICD-10-CM | POA: Diagnosis not present

## 2022-05-09 MED ORDER — PANTOPRAZOLE SODIUM 40 MG PO TBEC
40.0000 mg | DELAYED_RELEASE_TABLET | Freq: Every day | ORAL | 0 refills | Status: DC
  Filled 2022-05-09: qty 30, 30d supply, fill #0

## 2022-05-09 NOTE — Progress Notes (Signed)
Chief Complaint  Patient presents with   Abdominal Pain    Here for abdominal pain    Subjective: Patient is a 77 y.o. female here for f/u abd pain. Here w aid of [place] video interpreter.   Seen on 04/29/22 and tx'd for gastroenteritis. She takes Protonix 40 mg/d at a baseline. She was rx'd Zofran and given a shot of phenergan. No N/V/D. Still having some epigastric pain after she eats. The Zofran gave her constipation and she is no longer taking.   Past Medical History:  Diagnosis Date   Aortic atherosclerosis ([place])    Diabetes mellitus type 2 in nonobese [facility])    Essential hypertension    History of Helicobacter pylori infection    Hypercholesterolemia    Weight loss     Objective: BP 132/80 (BP Location: Right Arm, Patient Position: Sitting, Cuff Size: Normal)   Pulse 97   Temp 98 F (36.7 C) (Oral)   Resp 16   Ht 5' (1.524 m)   Wt 114 lb (51.7 kg)   SpO2 98%   BMI 22.26 kg/m  General: Awake, appears stated age Heart: RRR, no LE edema Abd: BS+, S, mild ttp in epigastric region, ND; neg Murphy's, Rovsing's, Carnett's, McBurney's Lungs: CTAB, no rales, wheezes or rhonchi. No accessory muscle use Psych: Age appropriate judgment and insight, normal affect and mood  Assessment and Plan: Epigastric pain  Hyponatremia  Elevated hemoglobin (HCC)  14 d of Protonix 40 mg/d. Phenergan prn as she did not tolerate Zofran. She has tolerated phenergan well in the past.  Reck today, was likely hypovolemia. Reck today. Was likely concentrated 2/2 hypovolemia.  F/u in 2 mo for DM visit.  The patient, thru interpreter, voiced understanding and agreement to the plan.  [place], DO 05/09/22  2:57 PM

## 2022-05-09 NOTE — Patient Instructions (Addendum)
Stay hydrated.   Give [place] 2-3 business days to get the results of your labs back.   Take the pantoprazole daily for the next 2 weeks to help calm down your stomach.   If you do not hear anything about your referral in the next 1-2 weeks, call our office and ask for an update.  Let us know if you need anything.

## 2022-05-10 LAB — CBC
HCT: 37.9 % (ref 35.0–45.0)
Hemoglobin: 13.1 g/dL (ref 11.7–15.5)
MCH: 32.3 pg (ref 27.0–33.0)
MCHC: 34.6 g/dL (ref 32.0–36.0)
MCV: 93.3 fL (ref 80.0–100.0)
MPV: 9.4 fL (ref 7.5–12.5)
Platelets: 217 10*3/uL (ref 140–400)
RBC: 4.06 10*6/uL (ref 3.80–5.10)
RDW: 11.8 % (ref 11.0–15.0)
WBC: 5.4 10*3/uL (ref 3.8–10.8)

## 2022-05-10 LAB — BASIC METABOLIC PANEL
BUN: 16 mg/dL (ref 7–25)
CO2: 26 mmol/L (ref 20–32)
Calcium: 9.5 mg/dL (ref 8.6–10.4)
Chloride: 99 mmol/L (ref 98–110)
Creat: 0.7 mg/dL (ref 0.60–1.00)
Glucose, Bld: 258 mg/dL — ABNORMAL HIGH (ref 65–99)
Potassium: 4 mmol/L (ref 3.5–5.3)
Sodium: 137 mmol/L (ref 135–146)

## 2022-05-13 LAB — ANTI-ISLET CELL ANTIBODY: Islet Cell Ab: NEGATIVE

## 2022-05-29 ENCOUNTER — Other Ambulatory Visit: Payer: Self-pay | Admitting: Family Medicine

## 2022-05-29 ENCOUNTER — Other Ambulatory Visit (HOSPITAL_BASED_OUTPATIENT_CLINIC_OR_DEPARTMENT_OTHER): Payer: Self-pay

## 2022-05-29 MED ORDER — CHLORTHALIDONE 25 MG PO TABS
25.0000 mg | ORAL_TABLET | Freq: Every day | ORAL | 1 refills | Status: DC
  Filled 2022-05-29: qty 90, 90d supply, fill #0
  Filled 2022-08-26: qty 90, 90d supply, fill #1

## 2022-06-26 ENCOUNTER — Other Ambulatory Visit: Payer: Self-pay

## 2022-06-26 ENCOUNTER — Other Ambulatory Visit: Payer: Self-pay | Admitting: Family Medicine

## 2022-06-26 ENCOUNTER — Other Ambulatory Visit (HOSPITAL_BASED_OUTPATIENT_CLINIC_OR_DEPARTMENT_OTHER): Payer: Self-pay

## 2022-06-26 DIAGNOSIS — B351 Tinea unguium: Secondary | ICD-10-CM

## 2022-06-26 DIAGNOSIS — R1013 Epigastric pain: Secondary | ICD-10-CM

## 2022-06-26 MED ORDER — PANTOPRAZOLE SODIUM 40 MG PO TBEC
40.0000 mg | DELAYED_RELEASE_TABLET | Freq: Every day | ORAL | 0 refills | Status: DC
  Filled 2022-06-26: qty 30, 30d supply, fill #0

## 2022-06-26 MED ORDER — TERBINAFINE HCL 250 MG PO TABS
250.0000 mg | ORAL_TABLET | Freq: Every day | ORAL | 2 refills | Status: DC
  Filled 2022-06-26: qty 90, 90d supply, fill #0
  Filled 2022-09-30: qty 90, 90d supply, fill #1
  Filled 2023-01-05: qty 90, 90d supply, fill #2

## 2022-06-26 NOTE — Addendum Note (Signed)
Addended by: Kelle Darting A on: 06/26/2022 02:42 PM   Modules accepted: Orders

## 2022-06-30 ENCOUNTER — Other Ambulatory Visit (INDEPENDENT_AMBULATORY_CARE_PROVIDER_SITE_OTHER): Payer: 59

## 2022-06-30 ENCOUNTER — Other Ambulatory Visit: Payer: Medicare Other

## 2022-06-30 DIAGNOSIS — E1165 Type 2 diabetes mellitus with hyperglycemia: Secondary | ICD-10-CM | POA: Diagnosis not present

## 2022-06-30 LAB — COMPREHENSIVE METABOLIC PANEL
ALT: 19 U/L (ref 0–35)
AST: 16 U/L (ref 0–37)
Albumin: 4.4 g/dL (ref 3.5–5.2)
Alkaline Phosphatase: 48 U/L (ref 39–117)
BUN: 12 mg/dL (ref 6–23)
CO2: 32 mEq/L (ref 19–32)
Calcium: 9.8 mg/dL (ref 8.4–10.5)
Chloride: 99 mEq/L (ref 96–112)
Creatinine, Ser: 0.61 mg/dL (ref 0.40–1.20)
GFR: 87.98 mL/min (ref >60.00)
Glucose, Bld: 116 mg/dL — ABNORMAL HIGH (ref 70–99)
Potassium: 4.5 mEq/L (ref 3.5–5.1)
Sodium: 139 mEq/L (ref 135–145)
Total Bilirubin: 0.4 mg/dL (ref 0.2–1.2)
Total Protein: 6.8 g/dL (ref 6.0–8.3)

## 2022-06-30 LAB — LIPID PANEL
Cholesterol: 117 mg/dL (ref 0–200)
HDL: 45.7 mg/dL (ref >39.00)
LDL Cholesterol: 54 mg/dL (ref 0–99)
NonHDL: 70.92
Total CHOL/HDL Ratio: 3
Triglycerides: 87 mg/dL (ref 0.0–149.0)
VLDL: 17.4 mg/dL (ref 0.0–40.0)

## 2022-06-30 LAB — HEMOGLOBIN A1C: Hgb A1c MFr Bld: 7.3 % — ABNORMAL HIGH (ref 4.6–6.5)

## 2022-07-02 ENCOUNTER — Ambulatory Visit (INDEPENDENT_AMBULATORY_CARE_PROVIDER_SITE_OTHER): Payer: 59 | Admitting: Gastroenterology

## 2022-07-02 ENCOUNTER — Other Ambulatory Visit (HOSPITAL_BASED_OUTPATIENT_CLINIC_OR_DEPARTMENT_OTHER): Payer: Self-pay

## 2022-07-02 ENCOUNTER — Encounter: Payer: Self-pay | Admitting: Gastroenterology

## 2022-07-02 VITALS — BP 132/64 | HR 76 | Ht 60.0 in | Wt 111.6 lb

## 2022-07-02 DIAGNOSIS — Z1211 Encounter for screening for malignant neoplasm of colon: Secondary | ICD-10-CM | POA: Diagnosis not present

## 2022-07-02 DIAGNOSIS — Z8619 Personal history of other infectious and parasitic diseases: Secondary | ICD-10-CM

## 2022-07-02 DIAGNOSIS — K30 Functional dyspepsia: Secondary | ICD-10-CM

## 2022-07-02 DIAGNOSIS — R1013 Epigastric pain: Secondary | ICD-10-CM

## 2022-07-02 DIAGNOSIS — R1010 Upper abdominal pain, unspecified: Secondary | ICD-10-CM

## 2022-07-02 MED ORDER — NA SULFATE-K SULFATE-MG SULF 17.5-3.13-1.6 GM/177ML PO SOLN
1.0000 | ORAL | 0 refills | Status: DC
  Filled 2022-07-02 – 2022-08-11 (×2): qty 354, 1d supply, fill #0

## 2022-07-02 NOTE — Progress Notes (Unsigned)
GASTROENTEROLOGY OUTPATIENT CLINIC VISIT   Primary Care Provider Shelda Pal, [address] [place] [address] [phone]  Referring Provider Shelda Pal, [address] [place],  [place] [address] [phone]  Patient Profile: Dawn Schwartz is a 77 y.o. female with a pmh significant for DM, HTN, HLD, prior HP infection (status post eradication), GERD.  The patient presents to the [facility] for an evaluation and management of problem(s) noted below:  Problem List 1. Colon cancer screening   2. Abdominal pain, epigastric   3. Acid indigestion   4. History of Helicobacter pylori infection     History of Present Illness: Please see prior notes for full details of HPI.  Interval History I have not seen the patient in nearly 4-1/2 years.  When I saw her previously we had discussed timing of potentially repeating her colonoscopy for colon cancer screening and at the same time and discussed the potential role of an upper endoscopy due to her [place] descent and increased risk for gastric cancer.  We did find that her H. pylori status was negative after previous treatment elsewhere.  She has done relatively well over the course of the last few years.  At times she will have some epigastric that comes and goes.  There is not a true prandial or postprandial or fasting status when which this can occur.  Acid indigestion is rare for her now.  She denies overt dysphagia or odynophagia symptoms.  With her background she does worry about gastric cancer and the risk of that.  Bowel habit perspective, she is relatively regular and is not noting any changes in her bowel habits.  She has not had any blood in her stools (melena/hematochezia) that has been noted.  She is due for her colon cancer screening at this time.  GI Review of Systems Positive as above  Negative for odynophagia, early satiety jaundice   Review of  Systems General: Denies fevers/chills/weight loss unintentionally Cardiovascular: Denies chest pain Pulmonary: Denies shortness of breath Gastroenterological: See HPI Genitourinary: Denies darkened urine Hematological: Denies easy bruising/bleeding Dermatological: Denies jaundice Psychological: Mood is stable   Medications Current Outpatient Medications  Medication Sig Dispense Refill   Alcohol Swabs (B-D SINGLE USE SWABS REGULAR) PADS Use daily to check blood sugar.  DX E11.9 100 each 6   aspirin EC 81 MG tablet Take 81 mg by mouth daily.     atorvastatin (LIPITOR) 40 MG tablet TAKE 1 TABLET BY MOUTH ONCE DAILY 90 tablet 3   Blood Glucose Monitoring Suppl (ONE TOUCH ULTRA 2) w/Device KIT Use once daily to check blood sugar.  DX E11.9 1 kit 0   chlorthalidone (HYGROTON) 25 MG tablet Take 1 tablet (25 mg total) by mouth daily. 90 tablet 1   COVID-19 mRNA bivalent vaccine, Pfizer, (PFIZER COVID-19 VAC BIVALENT) injection Inject into the muscle. 0.3 mL 0   glucose blood (ONETOUCH ULTRA) test strip Use once daily to check blood sugar 100 each 12   influenza vaccine adjuvanted (FLUAD) 0.5 ML injection Inject into the muscle. 0.5 mL 0   Lancets (ONETOUCH ULTRASOFT) lancets Use once daily to check blood sugar. 100 each 12   lidocaine (XYLOCAINE) 2 % solution Gargle/rinse with 5-10 mls by mouth every 6 hours as needed for pain over tongue. 100 mL 0   losartan (COZAAR) 100 MG tablet Take 1 tablet (100 mg total) by mouth daily. 90 tablet 1   metFORMIN (GLUCOPHAGE)  1000 MG tablet Take 1 tablet (1,000 mg total) by mouth 2 (two) times daily with a meal. 180 tablet 1   Multiple Vitamin (MULTIVITAMIN) tablet Take 1 tablet by mouth daily.     Na Sulfate-K Sulfate-Mg Sulf (SUPREP BOWEL PREP KIT) 17.5-3.13-1.6 GM/177ML SOLN Take 1 kit by mouth as directed. For colonoscopy prep 354 mL 0   pantoprazole (PROTONIX) 40 MG tablet Take 1 tablet (40 mg total) by mouth daily. 30 tablet 0   terbinafine (LAMISIL)  250 MG tablet Take 1 tablet (250 mg total) by mouth daily. 90 tablet 2   levocetirizine (XYZAL) 5 MG tablet Take 1 tablet (5 mg total) by mouth every evening. 90 tablet 2   No current facility-administered medications for this visit.    Allergies No Known Allergies  Histories Past Medical History:  Diagnosis Date   Aortic atherosclerosis (HCC)    Diabetes mellitus type 2 in nonobese (HCC)    Essential hypertension    History of Helicobacter pylori infection    Hypercholesterolemia    Weight loss    Past Surgical History:  Procedure Laterality Date   NO PAST SURGERIES     Social History   Socioeconomic History   Marital status: Married    Spouse name: Not on file   Number of children: 2   Years of education: Not on file   Highest education level: Not on file  Occupational History   Occupation: retired  Tobacco Use   Smoking status: Never   Smokeless tobacco: Never  Substance and Sexual Activity   Alcohol use: Never   Drug use: Never   Sexual activity: Not on file  Other Topics Concern   Not on file  Social History Narrative   Not on file   Social Determinants of Health   Financial Resource Strain: Not on file  Food Insecurity: Not on file  Transportation Needs: Not on file  Physical Activity: Not on file  Stress: Not on file  Social Connections: Not on file  Intimate Partner Violence: Not on file   Family History  Problem Relation Age of Onset   Hypertension Mother    Other Father        old age   Cancer Neg Hx    Heart disease Neg Hx    Colon cancer Neg Hx    Liver cancer Neg Hx    Stomach cancer Neg Hx    Rectal cancer Neg Hx    Esophageal cancer Neg Hx    I have reviewed her medical, social, and family history in detail and updated the electronic medical record as necessary.    PHYSICAL EXAMINATION  BP 132/64   Pulse 76   Ht 5' (1.524 m)   Wt 111 lb 9.6 oz (50.6 kg)   BMI 21.80 kg/m  Wt Readings from Last 3 Encounters:  07/02/22 111 lb  9.6 oz (50.6 kg)  05/09/22 114 lb (51.7 kg)  04/29/22 108 lb 12.8 oz (49.4 kg)  GEN: NAD, appears much younger than stated age, doesn't appear chronically ill, accompanied by family PSYCH: Cooperative, without pressured speech EYE: Conjunctivae pink, sclerae anicteric ENT: MMM CV: Nontachycardic RESP: No audible wheezing GI: NABS, soft, nontender, nondistended  MSK/EXT: No lower extremity edema SKIN: No jaundice NEURO:  Alert & Oriented x 3, no focal deficits   REVIEW OF DATA  I reviewed the following data at the time of this encounter:  GI Procedures and Studies  No new studies to review  Laboratory Studies  Reviewed those in epic  Imaging Studies  No relevant studies   ASSESSMENT  Ms. Mcglocklin is a 77 y.o. female with a pmh significant for DM, HTN, HLD, prior HP infection (status post eradication), GERD.  The patient is seen today for evaluation and management of:  1. Colon cancer screening   2. Abdominal pain, epigastric   3. Acid indigestion   4. History of Helicobacter pylori infection    The patient is clinically and hemodynamically stable.  She is still experiencing some episodes of epigastric abdominal discomfort and acid indigestion though they are rare.  She does have a history of H. pylori.  Her [place] descent does place her at increased risk for gastric cancer.  As such I think an upper endoscopy does make sense for her to ensure nothing else is occurring and that she has not reobtained H. pylori.  The patient is due for colon cancer screening and still remains a viable candidate for colonoscopy.  Will plan to do her screening colonoscopy at this same time as her upper endoscopy.  If her abdominal symptoms persist and she has a negative upper endoscopy, then we will recommend additional imaging including an ultrasound versus CT scan.  The patient may use PPI therapy or H2 RA therapy or Pepto-Bismol or Gaviscon if needed in the future while awaiting her endoscopic  evaluation.  The risks and benefits of endoscopic evaluation were discussed with the patient; these include but are not limited to the risk of perforation, infection, bleeding, missed lesions, lack of diagnosis, severe illness requiring hospitalization, as well as anesthesia and sedation related illnesses.  The patient and/or family is agreeable to proceed.  All patient questions were answered to the best of my ability, and the patient agrees to the aforementioned plan of action with follow-up as indicated.   PLAN  Proceed with scheduling screening colonoscopy Proceed with scheduling diagnostic endoscopy May use PPI or H2 RA or Gaviscon or Pepto-Bismol as needed If this workup remains negative and she continues to have issues of abdominal epigastric pain will consider ultrasound and cross-sectional imaging of the abdomen   Orders Placed This Encounter  Procedures   Ambulatory referral to Gastroenterology    New Prescriptions   NA SULFATE-K SULFATE-MG SULF (SUPREP BOWEL PREP KIT) 17.5-3.13-1.6 GM/177ML SOLN    Take 1 kit by mouth as directed. For colonoscopy prep   Modified Medications   No medications on file    Planned Follow Up: No follow-ups on file.   Total Time in Face-to-Face and in Coordination of Care for patient including independent/personal interpretation/review of prior testing, medical history, examination, medication adjustment, communicating results with the patient directly, and documentation within the EHR is 30 minutes.   Justice Britain, MD [facility] Office # [phone]

## 2022-07-02 NOTE — Patient Instructions (Addendum)
We have sent the following medications to your pharmacy for you to pick up at your convenience: [place] have been scheduled for an endoscopy. Please follow written instructions given to you at your visit today. If you use inhalers (even only as needed), please bring them with you on the day of your procedure.  _______________________________________________________  If your blood pressure at your visit was 140/90 or greater, please contact your primary care physician to follow up on this.  _______________________________________________________  If you are age 16 or older, your body mass index should be between 23-30. Your Body mass index is 21.8 kg/m. If this is out of the aforementioned range listed, please consider follow up with your Primary Care Provider.  If you are age 53 or younger, your body mass index should be between 19-25. Your Body mass index is 21.8 kg/m. If this is out of the aformentioned range listed, please consider follow up with your Primary Care Provider.   ________________________________________________________  The [facility] providers would like to encourage you to use [facility] to communicate with providers for non-urgent requests or questions.  Due to long hold times on the telephone, sending your provider a message by [facility] may be a faster and more efficient way to get a response.  Please allow 48 business hours for a response.  Please remember that this is for non-urgent requests.  _______________________________________________________  Thank you for choosing me and [facility].  Dr. Rush Landmark

## 2022-07-04 ENCOUNTER — Encounter: Payer: Self-pay | Admitting: Gastroenterology

## 2022-07-05 DIAGNOSIS — Z1211 Encounter for screening for malignant neoplasm of colon: Secondary | ICD-10-CM | POA: Insufficient documentation

## 2022-07-05 DIAGNOSIS — R1013 Epigastric pain: Secondary | ICD-10-CM | POA: Insufficient documentation

## 2022-07-07 ENCOUNTER — Ambulatory Visit (INDEPENDENT_AMBULATORY_CARE_PROVIDER_SITE_OTHER): Payer: 59 | Admitting: Family Medicine

## 2022-07-07 ENCOUNTER — Ambulatory Visit: Payer: Medicare Other | Admitting: Family Medicine

## 2022-07-07 ENCOUNTER — Encounter: Payer: Self-pay | Admitting: Family Medicine

## 2022-07-07 ENCOUNTER — Telehealth: Payer: Self-pay | Admitting: Family Medicine

## 2022-07-07 VITALS — BP 120/71 | HR 77 | Temp 98.0°F | Ht 62.0 in | Wt 111.2 lb

## 2022-07-07 DIAGNOSIS — R61 Generalized hyperhidrosis: Secondary | ICD-10-CM

## 2022-07-07 DIAGNOSIS — I700 Atherosclerosis of aorta: Secondary | ICD-10-CM

## 2022-07-07 DIAGNOSIS — I10 Essential (primary) hypertension: Secondary | ICD-10-CM | POA: Diagnosis not present

## 2022-07-07 DIAGNOSIS — E1165 Type 2 diabetes mellitus with hyperglycemia: Secondary | ICD-10-CM

## 2022-07-07 NOTE — Patient Instructions (Addendum)
Keep the diet clean and stay active.  You don't have to check your blood pressures at home unless you want.   Ice/cold pack over area for 10-15 min twice daily.  OK to take Tylenol 1000 mg (2 extra strength tabs) or 975 mg (3 regular strength tabs) every 6 hours as needed.  Consider Voltaren gel for your aching joints.   Try Pepcid/famotidine 20 mg with dinner time to see if it helps with your sweats. Let me know if it does not improve in the next month or so.  Foods that may reduce pain: 1) Ginger 2) Blueberries 3) Salmon 4) Pumpkin seeds 5) dark chocolate 6) turmeric 7) tart cherries 8) virgin olive oil 9) chilli peppers 10) mint 11) krill oil  Let us know if you need anything.

## 2022-07-07 NOTE — Telephone Encounter (Signed)
[facility] called, they do not take pt's ins.

## 2022-07-07 NOTE — Progress Notes (Signed)
Subjective:   Chief Complaint  Patient presents with   Follow-up    Toenail fungus--continue to take medication Dry mouth at night Night sweats (around her neck) Finger joints bent     Dawn Schwartz is a 77 y.o. female here for follow-up of diabetes.   Dawn Schwartz's self monitored glucose range is 90-low 100's.  Patient denies hypoglycemic reactions. She checks her glucose levels 3 time(s) per week. Patient does not require insulin.   Medications include: metformin 1000 mg bid Diet is healthy.  Exercise: walking, lifting wts No Cp or SOB.  Hypertension Patient presents for hypertension follow up. She does not monitor home blood pressures. She is compliant with medications. Patient has these side effects of medication: none Diet/exercise as above.   Hyperlipidemia/aortic atherosclerosis Patient presents for hyperlipidemia/AA follow up. Currently being treated with Lipitor 40 mg/d and compliance with treatment thus far has been good. She denies myalgias. Diet/exercise as above.  The patient is not known to have coexisting coronary artery disease.  Night sweats where she has sweat around her neck. Changes clothes 2x/night on average. She has associated dry mouth. Does not snore at night or wake up gasping for air. No recent travel to foreign countries.   Past Medical History:  Diagnosis Date   Aortic atherosclerosis ([place])    Diabetes mellitus type 2 in nonobese [facility])    Essential hypertension    History of Helicobacter pylori infection    Hypercholesterolemia    Weight loss      Related testing: Retinal exam: Due Pneumovax: done  Objective:  BP 120/71 (BP Location: Right Arm, Patient Position: Sitting, Cuff Size: Normal)   Pulse 77   Temp 98 F (36.7 C) (Oral)   Ht '5\' 2"'$  (1.575 m)   Wt 111 lb 3 oz (50.4 kg)   SpO2 99%   BMI 20.34 kg/m  General:  Well developed, well nourished, in no apparent distress Skin:  Warm, no pallor or diaphoresis Head:  Normocephalic,  atraumatic Eyes:  Pupils equal and round, sclera anicteric without injection  Lungs:  CTAB, no access msc use Cardio:  RRR, no bruits, no LE edema Musculoskeletal:  Symmetrical muscle groups noted without atrophy or deformity Neuro:  Sensation intact to pinprick on feet Psych: Age appropriate judgment and insight  Assessment:   Type 2 diabetes mellitus with hyperglycemia, without long-term current use of insulin (HCC) - Plan: Ambulatory referral to Ophthalmology  Essential hypertension  Aortic atherosclerosis (HCC)  Night sweats - Plan: QuantiFERON-TB Gold Plus   Plan:   Chronic, stable. Cont metformin 1000 mg bid. Counseled on diet and exercise. Needs to get her eye exam.  Chronic, stable. Cont losartan 100 mg/d, chlorthalidone 25 mg/d.  Chronic, stable. Cont Lipitor 40 mg/d.  Will trial Pepcid at night. Not on hypoglycemics. Recent labs nml. Ck Tb testing. If no improvement in a mo., I want to see her.  F/u in 6 mo. The patient voiced understanding and agreement to the plan.  [place], DO 07/07/22 10:20 AM

## 2022-07-09 LAB — QUANTIFERON-TB GOLD PLUS
Mitogen-NIL: 8.25 IU/mL
NIL: 0.04 IU/mL
QuantiFERON-TB Gold Plus: NEGATIVE
TB1-NIL: 0 IU/mL
TB2-NIL: 0 IU/mL

## 2022-07-10 ENCOUNTER — Other Ambulatory Visit (HOSPITAL_BASED_OUTPATIENT_CLINIC_OR_DEPARTMENT_OTHER): Payer: Self-pay

## 2022-07-14 ENCOUNTER — Ambulatory Visit: Payer: 59

## 2022-07-29 ENCOUNTER — Other Ambulatory Visit: Payer: Self-pay | Admitting: Family Medicine

## 2022-07-29 ENCOUNTER — Other Ambulatory Visit (HOSPITAL_BASED_OUTPATIENT_CLINIC_OR_DEPARTMENT_OTHER): Payer: Self-pay

## 2022-07-29 ENCOUNTER — Other Ambulatory Visit: Payer: Self-pay

## 2022-07-29 DIAGNOSIS — R1013 Epigastric pain: Secondary | ICD-10-CM

## 2022-07-29 MED ORDER — PANTOPRAZOLE SODIUM 40 MG PO TBEC
40.0000 mg | DELAYED_RELEASE_TABLET | Freq: Every day | ORAL | 0 refills | Status: DC
  Filled 2022-07-29: qty 30, 30d supply, fill #0

## 2022-08-08 ENCOUNTER — Other Ambulatory Visit (HOSPITAL_BASED_OUTPATIENT_CLINIC_OR_DEPARTMENT_OTHER): Payer: Self-pay

## 2022-08-11 ENCOUNTER — Other Ambulatory Visit: Payer: Self-pay | Admitting: Family Medicine

## 2022-08-11 ENCOUNTER — Other Ambulatory Visit (HOSPITAL_BASED_OUTPATIENT_CLINIC_OR_DEPARTMENT_OTHER): Payer: Self-pay

## 2022-08-11 DIAGNOSIS — R1013 Epigastric pain: Secondary | ICD-10-CM

## 2022-08-11 DIAGNOSIS — E1165 Type 2 diabetes mellitus with hyperglycemia: Secondary | ICD-10-CM

## 2022-08-12 ENCOUNTER — Other Ambulatory Visit (HOSPITAL_BASED_OUTPATIENT_CLINIC_OR_DEPARTMENT_OTHER): Payer: Self-pay

## 2022-08-26 ENCOUNTER — Other Ambulatory Visit (HOSPITAL_BASED_OUTPATIENT_CLINIC_OR_DEPARTMENT_OTHER): Payer: Self-pay

## 2022-08-26 ENCOUNTER — Other Ambulatory Visit: Payer: Self-pay | Admitting: Family Medicine

## 2022-08-26 ENCOUNTER — Other Ambulatory Visit (HOSPITAL_COMMUNITY): Payer: Self-pay

## 2022-08-26 DIAGNOSIS — R1013 Epigastric pain: Secondary | ICD-10-CM

## 2022-08-26 MED ORDER — PANTOPRAZOLE SODIUM 40 MG PO TBEC
40.0000 mg | DELAYED_RELEASE_TABLET | Freq: Every day | ORAL | 1 refills | Status: DC
  Filled 2022-08-26: qty 90, 90d supply, fill #0
  Filled 2022-10-14 – 2022-11-03 (×2): qty 90, 90d supply, fill #1

## 2022-08-29 ENCOUNTER — Encounter: Payer: 59 | Admitting: Gastroenterology

## 2022-08-29 ENCOUNTER — Ambulatory Visit (AMBULATORY_SURGERY_CENTER): Payer: 59 | Admitting: Gastroenterology

## 2022-08-29 ENCOUNTER — Other Ambulatory Visit (HOSPITAL_BASED_OUTPATIENT_CLINIC_OR_DEPARTMENT_OTHER): Payer: Self-pay

## 2022-08-29 ENCOUNTER — Encounter: Payer: Self-pay | Admitting: Gastroenterology

## 2022-08-29 VITALS — BP 131/61 | HR 59 | Temp 97.1°F | Resp 14 | Ht 60.0 in | Wt 111.0 lb

## 2022-08-29 DIAGNOSIS — K229 Disease of esophagus, unspecified: Secondary | ICD-10-CM | POA: Diagnosis not present

## 2022-08-29 DIAGNOSIS — D123 Benign neoplasm of transverse colon: Secondary | ICD-10-CM

## 2022-08-29 DIAGNOSIS — K2950 Unspecified chronic gastritis without bleeding: Secondary | ICD-10-CM

## 2022-08-29 DIAGNOSIS — I10 Essential (primary) hypertension: Secondary | ICD-10-CM | POA: Diagnosis not present

## 2022-08-29 DIAGNOSIS — K2970 Gastritis, unspecified, without bleeding: Secondary | ICD-10-CM

## 2022-08-29 DIAGNOSIS — Z1211 Encounter for screening for malignant neoplasm of colon: Secondary | ICD-10-CM | POA: Diagnosis not present

## 2022-08-29 DIAGNOSIS — E119 Type 2 diabetes mellitus without complications: Secondary | ICD-10-CM | POA: Diagnosis not present

## 2022-08-29 DIAGNOSIS — K317 Polyp of stomach and duodenum: Secondary | ICD-10-CM

## 2022-08-29 DIAGNOSIS — D131 Benign neoplasm of stomach: Secondary | ICD-10-CM

## 2022-08-29 DIAGNOSIS — R1013 Epigastric pain: Secondary | ICD-10-CM

## 2022-08-29 MED ORDER — SODIUM CHLORIDE 0.9 % IV SOLN
500.0000 mL | Freq: Once | INTRAVENOUS | Status: DC
Start: 2022-08-29 — End: 2022-08-29

## 2022-08-29 MED ORDER — PANTOPRAZOLE SODIUM 40 MG PO TBEC
40.0000 mg | DELAYED_RELEASE_TABLET | Freq: Two times a day (BID) | ORAL | 0 refills | Status: DC
Start: 2022-08-29 — End: 2022-11-10
  Filled 2022-08-29: qty 60, 30d supply, fill #0

## 2022-08-29 MED ORDER — SUCRALFATE 1 G PO TABS
1.0000 g | ORAL_TABLET | Freq: Two times a day (BID) | ORAL | 0 refills | Status: DC
Start: 2022-08-29 — End: 2023-02-13
  Filled 2022-08-29 – 2022-09-02 (×2): qty 28, 14d supply, fill #0

## 2022-08-29 NOTE — Progress Notes (Signed)
Pt's states no medical or surgical changes since previsit or office visit. VS assessed by D.T 

## 2022-08-29 NOTE — Op Note (Signed)
[facility] Patient Name: Dawn Schwartz Procedure Date: 08/29/2022 3:29 PM MRN: [id number] Endoscopist: Corliss Parish , MD, [id number] Age: 77 Referring MD:  Date of Birth: 08-Nov-1945 Gender: Female Account #: [id number] Procedure:                Upper GI endoscopy Indications:              Epigastric abdominal pain, Previously treated for                            Helicobacter pylori Medicines:                Monitored Anesthesia Care Procedure:                Pre-Anesthesia Assessment:                           - Prior to the procedure, a History and Physical                            was performed, and patient medications and                            allergies were reviewed. The patient's tolerance of                            previous anesthesia was also reviewed. The risks                            and benefits of the procedure and the sedation                            options and risks were discussed with the patient.                            All questions were answered, and informed consent                            was obtained. Prior Anticoagulants: The patient has                            taken no anticoagulant or antiplatelet agents                            except for aspirin. ASA Grade Assessment: II - A                            patient with mild systemic disease. After reviewing                            the risks and benefits, the patient was deemed in                            satisfactory condition to undergo the procedure.  After obtaining informed consent, the endoscope was                            passed under direct vision. Throughout the                            procedure, the patient's blood pressure, pulse, and                            oxygen saturations were monitored continuously. The                            GIF [id number] #[id number] was introduced through the                            mouth, and  advanced to the second part of duodenum.                            The upper GI endoscopy was accomplished without                            difficulty. The patient tolerated the procedure. Scope In: Scope Out: Findings:                 No gross lesions were noted in the entire esophagus.                           The Z-line was irregular and was found 38 cm from                            the incisors.                           Two, 8 to 10 mm sessile polyps with no bleeding and                            no stigmata of recent bleeding were found in the                            gastric antrum. These polyps were removed with a                            cold snare. Resection and retrieval were complete.                           Localized moderate inflammation characterized by                            erosions and erythema was found in the gastric                            antrum.  No other gross lesions were noted in the entire                            examined stomach. Biopsies were taken with a cold                            forceps for histology and Helicobacter pylori                            testing.                           No gross lesions were noted in the duodenal bulb,                            in the first portion of the duodenum and in the                            second portion of the duodenum. Biopsies were taken                            with a cold forceps for histology. Complications:            No immediate complications. Estimated Blood Loss:     Estimated blood loss was minimal. Impression:               - No gross lesions in the entire esophagus. Z-line                            irregular, 38 cm from the incisors.                           - Two gastric antral polyps. Resected and retrieved.                           - Antral gastritis. Other No gross lesions in the                            entire stomach. Biopsied.                            - No gross lesions in the duodenal bulb, in the                            first portion of the duodenum and in the second                            portion of the duodenum. Biopsied. Recommendation:           - Proceed to scheduled colonoscopy.                           - Increase PPI to 40 mg twice daily for 1 month.  Then may decrease back to once daily as needed.                           - Carafate 1 g twice daily for 2 weeks, then may                            stop.                           - Continue present medications.                           - Await pathology results.                           - Repeat upper endoscopy for surveillance based on                            pathology results if findings of adenomatous tissue                            are made (likely 1 year follow-up endoscopy).                           - The findings and recommendations were discussed                            with the patient.                           - The findings and recommendations were discussed                            with the patient's family. Corliss Parish, MD 08/29/2022 4:04:12 PM

## 2022-08-29 NOTE — Progress Notes (Signed)
GASTROENTEROLOGY PROCEDURE H&P NOTE   Primary Care Physician: Sharlene Dory, DO  HPI: Dawn Schwartz is a 77 y.o. female who presents for EGD/Colonoscopy to evaluate symptoms of acid indigestion, history of HP, Colon cancer screening.  Past Medical History:  Diagnosis Date   Aortic atherosclerosis (HCC)    Arthritis    Diabetes mellitus type 2 in nonobese [facility])    Essential hypertension    History of Helicobacter pylori infection    Hypercholesterolemia    Weight loss    Past Surgical History:  Procedure Laterality Date   NO PAST SURGERIES     Current Outpatient Medications  Medication Sig Dispense Refill   Alcohol Swabs (B-D SINGLE USE SWABS REGULAR) PADS Use daily to check blood sugar.  DX E11.9 100 each 6   aspirin EC 81 MG tablet Take 81 mg by mouth daily.     atorvastatin (LIPITOR) 40 MG tablet TAKE 1 TABLET BY MOUTH ONCE DAILY 90 tablet 3   Blood Glucose Monitoring Suppl (ONE TOUCH ULTRA 2) w/Device KIT Use once daily to check blood sugar.  DX E11.9 1 kit 0   chlorthalidone (HYGROTON) 25 MG tablet Take 1 tablet (25 mg total) by mouth daily. 90 tablet 1   glucose blood (ONETOUCH ULTRA) test strip Use once daily to check blood sugar 100 each 12   Lancets (ONETOUCH ULTRASOFT) lancets Use once daily to check blood sugar. 100 each 12   losartan (COZAAR) 100 MG tablet Take 1 tablet (100 mg total) by mouth daily. 90 tablet 1   metFORMIN (GLUCOPHAGE) 1000 MG tablet Take 1 tablet (1,000 mg total) by mouth 2 (two) times daily with a meal. 180 tablet 1   Multiple Vitamin (MULTIVITAMIN) tablet Take 1 tablet by mouth daily.     pantoprazole (PROTONIX) 40 MG tablet Take 1 tablet (40 mg total) by mouth daily. 90 tablet 1   COVID-19 mRNA bivalent vaccine, Pfizer, (PFIZER COVID-19 VAC BIVALENT) injection Inject into the muscle. 0.3 mL 0   influenza vaccine adjuvanted (FLUAD) 0.5 ML injection Inject into the muscle. 0.5 mL 0   levocetirizine (XYZAL) 5 MG tablet Take 1 tablet (5 mg  total) by mouth every evening. (Patient not taking: Reported on 08/29/2022) 90 tablet 2   lidocaine (XYLOCAINE) 2 % solution Gargle/rinse with 5-10 mls by mouth every 6 hours as needed for pain over tongue. (Patient not taking: Reported on 08/29/2022) 100 mL 0   terbinafine (LAMISIL) 250 MG tablet Take 1 tablet (250 mg total) by mouth daily. 90 tablet 2   Current Facility-Administered Medications  Medication Dose Route Frequency Provider Last Rate Last Admin   0.9 %  sodium chloride infusion  500 mL Intravenous Once Mansouraty, Netty Starring., MD        Current Outpatient Medications:    Alcohol Swabs (B-D SINGLE USE SWABS REGULAR) PADS, Use daily to check blood sugar.  DX E11.9, Disp: 100 each, Rfl: 6   aspirin EC 81 MG tablet, Take 81 mg by mouth daily., Disp: , Rfl:    atorvastatin (LIPITOR) 40 MG tablet, TAKE 1 TABLET BY MOUTH ONCE DAILY, Disp: 90 tablet, Rfl: 3   Blood Glucose Monitoring Suppl (ONE TOUCH ULTRA 2) w/Device KIT, Use once daily to check blood sugar.  DX E11.9, Disp: 1 kit, Rfl: 0   chlorthalidone (HYGROTON) 25 MG tablet, Take 1 tablet (25 mg total) by mouth daily., Disp: 90 tablet, Rfl: 1   glucose blood (ONETOUCH ULTRA) test strip, Use once daily to check blood  sugar, Disp: 100 each, Rfl: 12   Lancets (ONETOUCH ULTRASOFT) lancets, Use once daily to check blood sugar., Disp: 100 each, Rfl: 12   losartan (COZAAR) 100 MG tablet, Take 1 tablet (100 mg total) by mouth daily., Disp: 90 tablet, Rfl: 1   metFORMIN (GLUCOPHAGE) 1000 MG tablet, Take 1 tablet (1,000 mg total) by mouth 2 (two) times daily with a meal., Disp: 180 tablet, Rfl: 1   Multiple Vitamin (MULTIVITAMIN) tablet, Take 1 tablet by mouth daily., Disp: , Rfl:    pantoprazole (PROTONIX) 40 MG tablet, Take 1 tablet (40 mg total) by mouth daily., Disp: 90 tablet, Rfl: 1   COVID-19 mRNA bivalent vaccine, Pfizer, (PFIZER COVID-19 VAC BIVALENT) injection, Inject into the muscle., Disp: 0.3 mL, Rfl: 0   influenza vaccine adjuvanted  (FLUAD) 0.5 ML injection, Inject into the muscle., Disp: 0.5 mL, Rfl: 0   levocetirizine (XYZAL) 5 MG tablet, Take 1 tablet (5 mg total) by mouth every evening. (Patient not taking: Reported on 08/29/2022), Disp: 90 tablet, Rfl: 2   lidocaine (XYLOCAINE) 2 % solution, Gargle/rinse with 5-10 mls by mouth every 6 hours as needed for pain over tongue. (Patient not taking: Reported on 08/29/2022), Disp: 100 mL, Rfl: 0   terbinafine (LAMISIL) 250 MG tablet, Take 1 tablet (250 mg total) by mouth daily., Disp: 90 tablet, Rfl: 2  Current Facility-Administered Medications:    0.9 %  sodium chloride infusion, 500 mL, Intravenous, Once, Mansouraty, Netty Starring., MD No Known Allergies Family History  Problem Relation Age of Onset   Hypertension Mother    Other Father        old age   Cancer Neg Hx    Heart disease Neg Hx    Colon cancer Neg Hx    Liver cancer Neg Hx    Stomach cancer Neg Hx    Rectal cancer Neg Hx    Esophageal cancer Neg Hx    Social History   Socioeconomic History   Marital status: Married    Spouse name: Not on file   Number of children: 2   Years of education: Not on file   Highest education level: Not on file  Occupational History   Occupation: retired  Tobacco Use   Smoking status: Never   Smokeless tobacco: Never  Vaping Use   Vaping Use: Never used  Substance and Sexual Activity   Alcohol use: Never   Drug use: Never   Sexual activity: Not on file  Other Topics Concern   Not on file  Social History Narrative   Not on file   Social Determinants of Health   Financial Resource Strain: Not on file  Food Insecurity: Not on file  Transportation Needs: Not on file  Physical Activity: Not on file  Stress: Not on file  Social Connections: Not on file  Intimate Partner Violence: Not on file    Physical Exam: Today's Vitals   08/29/22 1449  BP: 133/65  Pulse: 72  Temp: (!) 97.1 F (36.2 C)  TempSrc: Skin  SpO2: 97%  Weight: 111 lb (50.3 kg)  Height: 5'  (1.524 m)   Body mass index is 21.68 kg/m. GEN: NAD EYE: Sclerae anicteric ENT: MMM CV: Non-tachycardic GI: Soft, NT/ND NEURO:  Alert & Oriented x 3  Lab Results: No results for input(s): "WBC", "HGB", "HCT", "PLT" in the last 72 hours. BMET No results for input(s): "NA", "K", "CL", "CO2", "GLUCOSE", "BUN", "CREATININE", "CALCIUM" in the last 72 hours. LFT No results for input(s): "PROT", "  ALBUMIN", "AST", "ALT", "ALKPHOS", "BILITOT", "BILIDIR", "IBILI" in the last 72 hours. PT/INR No results for input(s): "LABPROT", "INR" in the last 72 hours.   Impression / Plan: This is a 77 y.o.female who presents for EGD/Colonoscopy to evaluate symptoms of acid indigestion, history of HP, Colon cancer screening.  The risks and benefits of endoscopic evaluation/treatment were discussed with the patient and/or family; these include but are not limited to the risk of perforation, infection, bleeding, missed lesions, lack of diagnosis, severe illness requiring hospitalization, as well as anesthesia and sedation related illnesses.  The patient's history has been reviewed, patient examined, no change in status, and deemed stable for procedure.  The patient and/or family is agreeable to proceed.    Corliss Parish, MD  Gastroenterology Advanced Endoscopy Office # [phone]

## 2022-08-29 NOTE — Patient Instructions (Signed)
A prescription for Pantoprazole and Carafate has been sent to your pharmacy.   Await pathology results.  Use FiberCon 1-2 tablets by mouth daily.  Handouts on polyps and GERD provided.  YOU HAD AN ENDOSCOPIC PROCEDURE TODAY AT THE [facility]:   Refer to the procedure report that was given to you for any specific questions about what was found during the examination.  If the procedure report does not answer your questions, please call your gastroenterologist to clarify.  If you requested that your care partner not be given the details of your procedure findings, then the procedure report has been included in a sealed envelope for you to review at your convenience later.  YOU SHOULD EXPECT: Some feelings of bloating in the abdomen. Passage of more gas than usual.  Walking can help get rid of the air that was put into your GI tract during the procedure and reduce the bloating. If you had a lower endoscopy (such as a colonoscopy or flexible sigmoidoscopy) you may notice spotting of blood in your stool or on the toilet paper. If you underwent a bowel prep for your procedure, you may not have a normal bowel movement for a few days.  Please Note:  You might notice some irritation and congestion in your nose or some drainage.  This is from the oxygen used during your procedure.  There is no need for concern and it should clear up in a day or so.  SYMPTOMS TO REPORT IMMEDIATELY:  Following lower endoscopy (colonoscopy or flexible sigmoidoscopy):  Excessive amounts of blood in the stool  Significant tenderness or worsening of abdominal pains  Swelling of the abdomen that is new, acute  Fever of 100F or higher  Following upper endoscopy (EGD)  Vomiting of blood or coffee ground material  New chest pain or pain under the shoulder blades  Painful or persistently difficult swallowing  New shortness of breath  Fever of 100F or higher  Black, tarry-looking stools  For urgent or emergent  issues, a gastroenterologist can be reached at any hour by calling [phone]. Do not use MyChart messaging for urgent concerns.    DIET:  We do recommend a small meal at first, but then you may proceed to your regular diet.  Drink plenty of fluids but you should avoid alcoholic beverages for 24 hours.  ACTIVITY:  You should plan to take it easy for the rest of today and you should NOT DRIVE or use heavy machinery until tomorrow (because of the sedation medicines used during the test).    FOLLOW UP: Our staff will call the number listed on your records the next business day following your procedure.  We will call around 7:15- 8:00 am to check on you and address any questions or concerns that you may have regarding the information given to you following your procedure. If we do not reach you, we will leave a message.     If any biopsies were taken you will be contacted by phone or by letter within the next 1-3 weeks.  Please call us at [phone] if you have not heard about the biopsies in 3 weeks.    SIGNATURES/CONFIDENTIALITY: You and/or your care partner have signed paperwork which will be entered into your electronic medical record.  These signatures attest to the fact that that the information above on your After Visit Summary has been reviewed and is understood.  Full responsibility of the confidentiality of this discharge information lies with you and/or  your care-partner.

## 2022-08-29 NOTE — Op Note (Signed)
[facility] Patient Name: Dawn Schwartz Procedure Date: 08/29/2022 3:28 PM MRN: [id number] Endoscopist: Corliss Parish , MD, [id number] Age: 77 Referring MD:  Date of Birth: 07/18/1945 Gender: Female Account #: [id number] Procedure:                Colonoscopy Indications:              Screening for colorectal malignant neoplasm Medicines:                Monitored Anesthesia Care Procedure:                Pre-Anesthesia Assessment:                           - Prior to the procedure, a History and Physical                            was performed, and patient medications and                            allergies were reviewed. The patient's tolerance of                            previous anesthesia was also reviewed. The risks                            and benefits of the procedure and the sedation                            options and risks were discussed with the patient.                            All questions were answered, and informed consent                            was obtained. Prior Anticoagulants: The patient has                            taken no anticoagulant or antiplatelet agents                            except for aspirin. ASA Grade Assessment: II - A                            patient with mild systemic disease. After reviewing                            the risks and benefits, the patient was deemed in                            satisfactory condition to undergo the procedure.                           After obtaining informed consent, the colonoscope  was passed under direct vision. Throughout the                            procedure, the patient's blood pressure, pulse, and                            oxygen saturations were monitored continuously. The                            CF [id number] #[id number] was introduced through the anus                            and advanced to the 3 cm into the ileum. The                             colonoscopy was performed without difficulty. The                            patient tolerated the procedure. The quality of the                            bowel preparation was good. The terminal ileum,                            ileocecal valve, appendiceal orifice, and rectum                            were photographed. Scope In: 3:44:48 PM Scope Out: 3:57:11 PM Scope Withdrawal Time: 0 hours 9 minutes 6 seconds  Total Procedure Duration: 0 hours 12 minutes 23 seconds  Findings:                 Skin tags were found on perianal exam.                           The digital rectal exam findings include                            hemorrhoids. Pertinent negatives include no                            palpable rectal lesions.                           The terminal ileum and ileocecal valve appeared                            normal.                           Five sessile polyps were found in the transverse                            colon. The polyps were 2 to 7 mm in size. These  polyps were removed with a cold snare. Resection                            and retrieval were complete.                           Normal mucosa was found in the entire colon                            otherwise.                           Anal papilla was hypertrophied.                           Non-bleeding non-thrombosed internal hemorrhoids                            were found during retroflexion, during perianal                            exam and during digital exam. The hemorrhoids were                            Grade II (internal hemorrhoids that prolapse but                            reduce spontaneously). Complications:            No immediate complications. Estimated Blood Loss:     Estimated blood loss was minimal. Impression:               - Perianal skin tags found on perianal exam.                           - Hemorrhoids found on digital rectal exam.                            - The examined portion of the ileum was normal.                           - Five, 2 to 7 mm polyps in the transverse colon,                            removed with a cold snare. Resected and retrieved.                           - Normal mucosa in the entire examined colon.                           - Anal papilla was hypertrophied.                           - Non-bleeding non-thrombosed internal hemorrhoids. Recommendation:           - The patient will be observed post-procedure,  until all discharge criteria are met.                           - Discharge patient to home.                           - Patient has a contact number available for                            emergencies. The signs and symptoms of potential                            delayed complications were discussed with the                            patient. Return to normal activities tomorrow.                            Written discharge instructions were provided to the                            patient.                           - High fiber diet.                           - Use FiberCon 1-2 tablets PO daily.                           - Continue present medications.                           - Await pathology results.                           - Repeat colonoscopy in 3/5/7 years for                            surveillance based on pathology results (then based                            on patient's medical comorbidities and health at                            the time).                           - The findings and recommendations were discussed                            with the patient.                           - The findings and recommendations were discussed  with the patient's family. Corliss Parish, MD 08/29/2022 4:08:19 PM

## 2022-08-29 NOTE — Progress Notes (Signed)
Called to room to assist during endoscopic procedure.  Patient ID and intended procedure confirmed with present staff. Received instructions for my participation in the procedure from the performing physician.  

## 2022-08-29 NOTE — Progress Notes (Signed)
Report to PACU, RN, vss, BBS= Clear.  

## 2022-09-01 ENCOUNTER — Telehealth: Payer: Self-pay

## 2022-09-01 ENCOUNTER — Other Ambulatory Visit (HOSPITAL_BASED_OUTPATIENT_CLINIC_OR_DEPARTMENT_OTHER): Payer: Self-pay

## 2022-09-01 NOTE — Telephone Encounter (Signed)
No answer, left message to call if having any issue or concerns, B.Muhanad Torosyan RN. 

## 2022-09-02 ENCOUNTER — Other Ambulatory Visit (HOSPITAL_BASED_OUTPATIENT_CLINIC_OR_DEPARTMENT_OTHER): Payer: Self-pay

## 2022-09-05 ENCOUNTER — Encounter: Payer: Self-pay | Admitting: Gastroenterology

## 2022-09-30 ENCOUNTER — Other Ambulatory Visit (HOSPITAL_BASED_OUTPATIENT_CLINIC_OR_DEPARTMENT_OTHER): Payer: Self-pay

## 2022-10-14 ENCOUNTER — Other Ambulatory Visit (HOSPITAL_BASED_OUTPATIENT_CLINIC_OR_DEPARTMENT_OTHER): Payer: Self-pay

## 2022-10-21 ENCOUNTER — Other Ambulatory Visit (HOSPITAL_BASED_OUTPATIENT_CLINIC_OR_DEPARTMENT_OTHER): Payer: Self-pay

## 2022-10-23 ENCOUNTER — Other Ambulatory Visit (HOSPITAL_BASED_OUTPATIENT_CLINIC_OR_DEPARTMENT_OTHER): Payer: Self-pay

## 2022-11-10 ENCOUNTER — Other Ambulatory Visit: Payer: Self-pay | Admitting: Family Medicine

## 2022-11-10 ENCOUNTER — Other Ambulatory Visit (HOSPITAL_BASED_OUTPATIENT_CLINIC_OR_DEPARTMENT_OTHER): Payer: Self-pay

## 2022-11-10 DIAGNOSIS — Z0000 Encounter for general adult medical examination without abnormal findings: Secondary | ICD-10-CM

## 2022-11-10 DIAGNOSIS — I10 Essential (primary) hypertension: Secondary | ICD-10-CM

## 2022-11-10 DIAGNOSIS — E1165 Type 2 diabetes mellitus with hyperglycemia: Secondary | ICD-10-CM

## 2022-11-10 DIAGNOSIS — R1013 Epigastric pain: Secondary | ICD-10-CM

## 2022-11-10 MED ORDER — METFORMIN HCL 1000 MG PO TABS
1000.0000 mg | ORAL_TABLET | Freq: Two times a day (BID) | ORAL | 1 refills | Status: DC
  Filled 2022-11-10: qty 180, 90d supply, fill #0

## 2022-11-10 MED ORDER — CHLORTHALIDONE 25 MG PO TABS
25.0000 mg | ORAL_TABLET | Freq: Every day | ORAL | 1 refills | Status: DC
  Filled 2022-11-10: qty 90, 90d supply, fill #0

## 2022-11-10 MED ORDER — PANTOPRAZOLE SODIUM 40 MG PO TBEC
40.0000 mg | DELAYED_RELEASE_TABLET | Freq: Two times a day (BID) | ORAL | 3 refills | Status: DC
Start: 2022-11-10 — End: 2023-02-13
  Filled 2022-11-10 (×2): qty 180, 90d supply, fill #0

## 2022-11-10 MED ORDER — LOSARTAN POTASSIUM 100 MG PO TABS
100.0000 mg | ORAL_TABLET | Freq: Every day | ORAL | 3 refills | Status: DC
  Filled 2022-11-10: qty 90, 90d supply, fill #0
  Filled 2023-02-03: qty 90, 90d supply, fill #1
  Filled 2023-04-27: qty 90, 90d supply, fill #2
  Filled 2023-07-29: qty 90, 90d supply, fill #3

## 2022-11-10 MED ORDER — CHLORTHALIDONE 25 MG PO TABS
25.0000 mg | ORAL_TABLET | Freq: Every day | ORAL | 3 refills | Status: DC
  Filled 2022-11-10: qty 90, 90d supply, fill #0
  Filled 2023-02-20: qty 90, 90d supply, fill #1
  Filled 2023-05-25: qty 90, 90d supply, fill #2
  Filled 2023-08-24: qty 90, 90d supply, fill #3

## 2022-11-10 MED ORDER — LOSARTAN POTASSIUM 100 MG PO TABS
100.0000 mg | ORAL_TABLET | Freq: Every day | ORAL | 1 refills | Status: DC
  Filled 2022-11-10: qty 90, 90d supply, fill #0

## 2022-11-10 MED ORDER — METFORMIN HCL 1000 MG PO TABS
1000.0000 mg | ORAL_TABLET | Freq: Two times a day (BID) | ORAL | 3 refills | Status: DC
  Filled 2022-11-10: qty 180, 90d supply, fill #0
  Filled 2023-02-20: qty 180, 90d supply, fill #1
  Filled 2023-05-14: qty 180, 90d supply, fill #2
  Filled 2023-08-17: qty 180, 90d supply, fill #3

## 2022-12-11 ENCOUNTER — Other Ambulatory Visit (HOSPITAL_BASED_OUTPATIENT_CLINIC_OR_DEPARTMENT_OTHER): Payer: Self-pay

## 2022-12-25 DIAGNOSIS — H25013 Cortical age-related cataract, bilateral: Secondary | ICD-10-CM | POA: Diagnosis not present

## 2022-12-25 DIAGNOSIS — H04123 Dry eye syndrome of bilateral lacrimal glands: Secondary | ICD-10-CM | POA: Diagnosis not present

## 2022-12-25 DIAGNOSIS — H02834 Dermatochalasis of left upper eyelid: Secondary | ICD-10-CM | POA: Diagnosis not present

## 2022-12-25 DIAGNOSIS — H524 Presbyopia: Secondary | ICD-10-CM | POA: Diagnosis not present

## 2022-12-25 DIAGNOSIS — E119 Type 2 diabetes mellitus without complications: Secondary | ICD-10-CM | POA: Diagnosis not present

## 2022-12-25 DIAGNOSIS — H5203 Hypermetropia, bilateral: Secondary | ICD-10-CM | POA: Diagnosis not present

## 2022-12-25 DIAGNOSIS — H43393 Other vitreous opacities, bilateral: Secondary | ICD-10-CM | POA: Diagnosis not present

## 2022-12-25 DIAGNOSIS — H02831 Dermatochalasis of right upper eyelid: Secondary | ICD-10-CM | POA: Diagnosis not present

## 2022-12-25 DIAGNOSIS — H2513 Age-related nuclear cataract, bilateral: Secondary | ICD-10-CM | POA: Diagnosis not present

## 2022-12-25 DIAGNOSIS — Z7984 Long term (current) use of oral hypoglycemic drugs: Secondary | ICD-10-CM | POA: Diagnosis not present

## 2022-12-25 DIAGNOSIS — H52203 Unspecified astigmatism, bilateral: Secondary | ICD-10-CM | POA: Diagnosis not present

## 2022-12-25 LAB — HM DIABETES EYE EXAM

## 2022-12-26 ENCOUNTER — Other Ambulatory Visit (HOSPITAL_BASED_OUTPATIENT_CLINIC_OR_DEPARTMENT_OTHER): Payer: Self-pay

## 2022-12-31 ENCOUNTER — Other Ambulatory Visit (HOSPITAL_BASED_OUTPATIENT_CLINIC_OR_DEPARTMENT_OTHER): Payer: Self-pay

## 2023-01-05 ENCOUNTER — Other Ambulatory Visit (INDEPENDENT_AMBULATORY_CARE_PROVIDER_SITE_OTHER): Payer: 59

## 2023-01-05 ENCOUNTER — Other Ambulatory Visit (HOSPITAL_BASED_OUTPATIENT_CLINIC_OR_DEPARTMENT_OTHER): Payer: Self-pay

## 2023-01-05 DIAGNOSIS — Z0000 Encounter for general adult medical examination without abnormal findings: Secondary | ICD-10-CM | POA: Diagnosis not present

## 2023-01-05 DIAGNOSIS — E1165 Type 2 diabetes mellitus with hyperglycemia: Secondary | ICD-10-CM

## 2023-01-05 DIAGNOSIS — I10 Essential (primary) hypertension: Secondary | ICD-10-CM

## 2023-01-05 LAB — CBC
HCT: 42.9 % (ref 36.0–46.0)
Hemoglobin: 13.7 g/dL (ref 12.0–15.0)
MCHC: 32 g/dL (ref 30.0–36.0)
MCV: 91.1 fl (ref 78.0–100.0)
Platelets: 233 10*3/uL (ref 150.0–400.0)
RBC: 4.7 Mil/uL (ref 3.87–5.11)
RDW: 14.7 % (ref 11.5–15.5)
WBC: 6.9 10*3/uL (ref 4.0–10.5)

## 2023-01-05 LAB — COMPREHENSIVE METABOLIC PANEL
ALT: 17 U/L (ref 0–35)
AST: 16 U/L (ref 0–37)
Albumin: 4.5 g/dL (ref 3.5–5.2)
Alkaline Phosphatase: 50 U/L (ref 39–117)
BUN: 20 mg/dL (ref 6–23)
CO2: 31 meq/L (ref 19–32)
Calcium: 9.7 mg/dL (ref 8.4–10.5)
Chloride: 100 meq/L (ref 96–112)
Creatinine, Ser: 0.6 mg/dL (ref 0.40–1.20)
GFR: 88.01 mL/min (ref >60.00)
Glucose, Bld: 116 mg/dL — ABNORMAL HIGH (ref 70–99)
Potassium: 4.4 meq/L (ref 3.5–5.1)
Sodium: 140 meq/L (ref 135–145)
Total Bilirubin: 0.5 mg/dL (ref 0.2–1.2)
Total Protein: 7 g/dL (ref 6.0–8.3)

## 2023-01-05 LAB — LIPID PANEL
Cholesterol: 116 mg/dL (ref 0–200)
HDL: 48.7 mg/dL (ref >39.00)
LDL Cholesterol: 44 mg/dL (ref 0–99)
NonHDL: 67.02
Total CHOL/HDL Ratio: 2
Triglycerides: 114 mg/dL (ref 0.0–149.0)
VLDL: 22.8 mg/dL (ref 0.0–40.0)

## 2023-01-05 LAB — MICROALBUMIN / CREATININE URINE RATIO
Creatinine,U: 35.4 mg/dL
Microalb Creat Ratio: 2 mg/g (ref 0.0–30.0)
Microalb, Ur: <0.7 mg/dL (ref 0.0–1.9)

## 2023-01-05 LAB — HEMOGLOBIN A1C: Hgb A1c MFr Bld: 7.6 % — ABNORMAL HIGH (ref 4.6–6.5)

## 2023-01-12 ENCOUNTER — Ambulatory Visit (INDEPENDENT_AMBULATORY_CARE_PROVIDER_SITE_OTHER): Payer: 59 | Admitting: Family Medicine

## 2023-01-12 ENCOUNTER — Encounter: Payer: Self-pay | Admitting: Family Medicine

## 2023-01-12 ENCOUNTER — Other Ambulatory Visit (HOSPITAL_BASED_OUTPATIENT_CLINIC_OR_DEPARTMENT_OTHER): Payer: Self-pay

## 2023-01-12 VITALS — BP 121/71 | HR 64 | Temp 98.0°F | Ht 62.0 in | Wt 110.0 lb

## 2023-01-12 DIAGNOSIS — E2839 Other primary ovarian failure: Secondary | ICD-10-CM | POA: Diagnosis not present

## 2023-01-12 DIAGNOSIS — M25512 Pain in left shoulder: Secondary | ICD-10-CM | POA: Diagnosis not present

## 2023-01-12 DIAGNOSIS — Z0000 Encounter for general adult medical examination without abnormal findings: Secondary | ICD-10-CM

## 2023-01-12 DIAGNOSIS — L282 Other prurigo: Secondary | ICD-10-CM

## 2023-01-12 DIAGNOSIS — G8929 Other chronic pain: Secondary | ICD-10-CM

## 2023-01-12 DIAGNOSIS — R2231 Localized swelling, mass and lump, right upper limb: Secondary | ICD-10-CM | POA: Diagnosis not present

## 2023-01-12 DIAGNOSIS — M6752 Plica syndrome, left knee: Secondary | ICD-10-CM | POA: Diagnosis not present

## 2023-01-12 MED ORDER — LEVOCETIRIZINE DIHYDROCHLORIDE 5 MG PO TABS
5.0000 mg | ORAL_TABLET | Freq: Every evening | ORAL | 2 refills | Status: AC
  Filled 2023-01-12: qty 90, 90d supply, fill #0

## 2023-01-12 MED ORDER — MELOXICAM 15 MG PO TABS
15.0000 mg | ORAL_TABLET | Freq: Every day | ORAL | 0 refills | Status: DC
Start: 2023-01-12 — End: 2023-02-13
  Filled 2023-01-12: qty 30, 30d supply, fill #0

## 2023-01-12 NOTE — Patient Instructions (Addendum)
Keep the diet clean and stay active.  We will be in touch regarding your bone density scan.   Try not to scratch as this can make things worse. Avoid scented products while dealing with this. You may resume when the itchiness resolves. Cold/cool compresses can help.   If the itching does not improve over the next few weeks on the levocetirizine/Xyzal, please let me know.   Heat (pad or rice pillow in microwave) over affected area, 10-15 minutes twice daily.   Ice/cold pack over area for 10-15 min twice daily.  OK to take Tylenol 1000 mg (2 extra strength tabs) or 975 mg (3 regular strength tabs) every 6 hours as needed.  Let us know if you need anything.  Knee Exercises It is normal to feel mild stretching, pulling, tightness, or discomfort as you do these exercises, but you should stop right away if you feel sudden pain or your pain gets worse.  STRETCHING AND RANGE OF MOTION EXERCISES  These exercises warm up your muscles and joints and improve the movement and flexibility of your knee. These exercises also help to relieve pain, numbness, and tingling. Exercise A: Knee Extension, Prone  Lie on your abdomen on a bed. Place your left / right knee just beyond the edge of the surface so your knee is not on the bed. You can put a towel under your left / right thigh just above your knee for comfort. Relax your leg muscles and allow gravity to straighten your knee. You should feel a stretch behind your left / right knee. Hold this position for 30 seconds. Scoot up so your knee is supported between repetitions. Repeat 2 times. Complete this stretch 3 times per week. Exercise B: Knee Flexion, Active     Lie on your back with both knees straight. If this causes back discomfort, bend your left / right knee so your foot is flat on the floor. Slowly slide your left / right heel back toward your buttocks until you feel a gentle stretch in the front of your knee or thigh. Hold this position for 30  seconds. Slowly slide your left / right heel back to the starting position. Repeat 2 times. Complete this exercise 3 times per week. Exercise C: Quadriceps, Prone     Lie on your abdomen on a firm surface, such as a bed or padded floor. Bend your left / right knee and hold your ankle. If you cannot reach your ankle or pant leg, loop a belt around your foot and grab the belt instead. Gently pull your heel toward your buttocks. Your knee should not slide out to the side. You should feel a stretch in the front of your thigh and knee. Hold this position for 30 seconds. Repeat 2 times. Complete this stretch 3 times per week. Exercise D: Hamstring, Supine  Lie on your back. Loop a belt or towel over the ball of your left / right foot. The ball of your foot is on the walking surface, right under your toes. Straighten your left / right knee and slowly pull on the belt to raise your leg until you feel a gentle stretch behind your knee. Do not let your left / right knee bend while you do this. Keep your other leg flat on the floor. Hold this position for 30 seconds. Repeat 2 times. Complete this stretch 3 times per week. STRENGTHENING EXERCISES  These exercises build strength and endurance in your knee. Endurance is the ability to use your  muscles for a long time, even after they get tired. Exercise E: Quadriceps, Isometric     Lie on your back with your left / right leg extended and your other knee bent. Put a rolled towel or small pillow under your knee if told by your health care provider. Slowly tense the muscles in the front of your left / right thigh. You should see your kneecap slide up toward your hip or see increased dimpling just above the knee. This motion will push the back of the knee toward the floor. For 3 seconds, keep the muscle as tight as you can without increasing your pain. Relax the muscles slowly and completely. Repeat for 10 total reps Repeat 2 ti mes. Complete this exercise  3 times per week. Exercise F: Straight Leg Raises - Quadriceps  Lie on your back with your left / right leg extended and your other knee bent. Tense the muscles in the front of your left / right thigh. You should see your kneecap slide up or see increased dimpling just above the knee. Your thigh may even shake a bit. Keep these muscles tight as you raise your leg 4-6 inches (10-15 cm) off the floor. Do not let your knee bend. Hold this position for 3 seconds. Keep these muscles tense as you lower your leg. Relax your muscles slowly and completely after each repetition. 10 total reps. Repeat 2 times. Complete this exercise 3 times per week.  Exercise G: Hamstring Curls     If told by your health care provider, do this exercise while wearing ankle weights. Begin with 5 lb weights (optional). Then increase the weight by 1 lb (0.5 kg) increments. Do not wear ankle weights that are more than 20 lbs to start with. Lie on your abdomen with your legs straight. Bend your left / right knee as far as you can without feeling pain. Keep your hips flat against the floor. Hold this position for 3 seconds. Slowly lower your leg to the starting position. Repeat for 10 reps.  Repeat 2 times. Complete this exercise 3 times per week. Exercise H: Squats (Quadriceps)  Stand in front of a table, with your feet and knees pointing straight ahead. You may rest your hands on the table for balance but not for support. Slowly bend your knees and lower your hips like you are going to sit in a chair. Keep your weight over your heels, not over your toes. Keep your lower legs upright so they are parallel with the table legs. Do not let your hips go lower than your knees. Do not bend lower than told by your health care provider. If your knee pain increases, do not bend as low. Hold the squat position for 1 second. Slowly push with your legs to return to standing. Do not use your hands to pull yourself to standing. Repeat  2 times. Complete this exercise 3 times per week. Exercise I: Wall Slides (Quadriceps)     Lean your back against a smooth wall or door while you walk your feet out 18-24 inches (46-61 cm) from it. Place your feet hip-width apart. Slowly slide down the wall or door until your knees Repeat 2 times. Complete this exercise every other day. Exercise K: Straight Leg Raises - Hip Abductors  Lie on your side with your left / right leg in the top position. Lie so your head, shoulder, knee, and hip line up. You may bend your bottom knee to help you keep your balance.  Roll your hips slightly forward so your hips are stacked directly over each other and your left / right knee is facing forward. Leading with your heel, lift your top leg 4-6 inches (10-15 cm). You should feel the muscles in your outer hip lifting. Do not let your foot drift forward. Do not let your knee roll toward the ceiling. Hold this position for 3 seconds. Slowly return your leg to the starting position. Let your muscles relax completely after each repetition. 10 total reps. Repeat 2 times. Complete this exercise 3 times per week. Exercise J: Straight Leg Raises - Hip Extensors  Lie on your abdomen on a firm surface. You can put a pillow under your hips if that is more comfortable. Tense the muscles in your buttocks and lift your left / right leg about 4-6 inches (10-15 cm). Keep your knee straight as you lift your leg. Hold this position for 3 seconds. Slowly lower your leg to the starting position. Let your leg relax completely after each repetition. Repeat 2 times. Complete this exercise 3 times per week. Document Released: 02/26/2005 Document Revised: 01/07/2016 Document Reviewed: 02/18/2015 Else[employer] Interactive Patient Education  2017 [employer]  EXERCISES  RANGE OF MOTION (ROM) AND STRETCHING EXERCISES These exercises may help you when beginning to rehabilitate your injury. While completing these exercises, remember:   Restoring tissue flexibility helps normal motion to return to the joints. This allows healthier, less painful movement and activity. An effective stretch should be held for at least 30 seconds. A stretch should never be painful. You should only feel a gentle lengthening or release in the stretched tissue.  ROM - Pendulum Bend at the waist so that your right / left arm falls away from your body. Support yourself with your opposite hand on a solid surface, such as a table or a countertop. Your right / left arm should be perpendicular to the ground. If it is not perpendicular, you need to lean over farther. Relax the muscles in your right / left arm and shoulder as much as possible. Gently sway your hips and trunk so they move your right / left arm without any use of your right / left shoulder muscles. Progress your movements so that your right / left arm moves side to side, then forward and backward, and finally, both clockwise and counterclockwise. Complete 10-15 repetitions in each direction. Many people use this exercise to relieve discomfort in their shoulder as well as to gain range of motion. Repeat 2 times. Complete this exercise 3 times per week.  STRETCH - Flexion, Standing Stand with good posture. With an underhand grip on your right / left hand and an overhand grip on the opposite hand, grasp a broomstick or cane so that your hands are a little more than shoulder-width apart. Keeping your right / left elbow straight and shoulder muscles relaxed, push the stick with your opposite hand to raise your right / left arm in front of your body and then overhead. Raise your arm until you feel a stretch in your right / left shoulder, but before you have increased shoulder pain. Try to avoid shrugging your right / left shoulder as your arm rises by keeping your shoulder blade tucked down and toward your mid-back spine. Hold 30 seconds. Slowly return to the starting position. Repeat 2 times. Complete  this exercise 3 times per week.  STRETCH - Internal Rotation Place your right / left hand behind your back, palm-up. Throw a towel or belt over your  opposite shoulder. Grasp the towel/belt with your right / left hand. While keeping an upright posture, gently pull up on the towel/belt until you feel a stretch in the front of your right / left shoulder. Avoid shrugging your right / left shoulder as your arm rises by keeping your shoulder blade tucked down and toward your mid-back spine. Hold 30. Release the stretch by lowering your opposite hand. Repeat 2 times. Complete this exercise 3 times per week.  STRETCH - External Rotation and Abduction Stagger your stance through a doorframe. It does not matter which foot is forward. As instructed by your physician, physical therapist or athletic trainer, place your hands: And forearms above your head and on the door frame. And forearms at head-height and on the door frame. At elbow-height and on the door frame. Keeping your head and chest upright and your stomach muscles tight to prevent over-extending your low-back, slowly shift your weight onto your front foot until you feel a stretch across your chest and/or in the front of your shoulders. Hold 30 seconds. Shift your weight to your back foot to release the stretch. Repeat 2 times. Complete this stretch 3 times per week.   STRENGTHENING EXERCISES  These exercises may help you when beginning to rehabilitate your injury. They may resolve your symptoms with or without further involvement from your physician, physical therapist or athletic trainer. While completing these exercises, remember:  Muscles can gain both the endurance and the strength needed for everyday activities through controlled exercises. Complete these exercises as instructed by your physician, physical therapist or athletic trainer. Progress the resistance and repetitions only as guided. You may experience muscle soreness or fatigue,  but the pain or discomfort you are trying to eliminate should never worsen during these exercises. If this pain does worsen, stop and make certain you are following the directions exactly. If the pain is still present after adjustments, discontinue the exercise until you can discuss the trouble with your clinician. If advised by your physician, during your recovery, avoid activity or exercises which involve actions that place your right / left hand or elbow above your head or behind your back or head. These positions stress the tissues which are trying to heal.  STRENGTH - Scapular Depression and Adduction With good posture, sit on a firm chair. Supported your arms in front of you with pillows, arm rests or a table top. Have your elbows in line with the sides of your body. Gently draw your shoulder blades down and toward your mid-back spine. Gradually increase the tension without tensing the muscles along the top of your shoulders and the back of your neck. Hold for 3 seconds. Slowly release the tension and relax your muscles completely before completing the next repetition. After you have practiced this exercise, remove the arm support and complete it in standing as well as sitting. Repeat 2 times. Complete this exercise 3 times per week.   STRENGTH - External Rotators Secure a rubber exercise band/tubing to a fixed object so that it is at the same height as your right / left elbow when you are standing or sitting on a firm surface. Stand or sit so that the secured exercise band/tubing is at your side that is not injured. Bend your elbow 90 degrees. Place a folded towel or small pillow under your right / left arm so that your elbow is a few inches away from your side. Keeping the tension on the exercise band/tubing, pull it away from your body, as  if pivoting on your elbow. Be sure to keep your body steady so that the movement is only coming from your shoulder rotating. Hold 3 seconds. Release the  tension in a controlled manner as you return to the starting position. Repeat 2 times. Complete this exercise 3 times per week.   STRENGTH - Supraspinatus Stand or sit with good posture. Grasp a 2-3 lb weight or an exercise band/tubing so that your hand is "thumbs-up," like when you shake hands. Slowly lift your right / left hand from your thigh into the air, traveling about 30 degrees from straight out at your side. Lift your hand to shoulder height or as far as you can without increasing any shoulder pain. Initially, many people do not lift their hands above shoulder height. Avoid shrugging your right / left shoulder as your arm rises by keeping your shoulder blade tucked down and toward your mid-back spine. Hold for 3 seconds. Control the descent of your hand as you slowly return to your starting position. Repeat 2 times. Complete this exercise 3 times per week.   STRENGTH - Shoulder Extensors Secure a rubber exercise band/tubing so that it is at the height of your shoulders when you are either standing or sitting on a firm arm-less chair. With a thumbs-up grip, grasp an end of the band/tubing in each hand. Straighten your elbows and lift your hands straight in front of you at shoulder height. Step back away from the secured end of band/tubing until it becomes tense. Squeezing your shoulder blades together, pull your hands down to the sides of your thighs. Do not allow your hands to go behind you. Hold for 3 seconds. Slowly ease the tension on the band/tubing as you reverse the directions and return to the starting position. Repeat 2 times. Complete this exercise 3 times per week.   STRENGTH - Scapular Retractors Secure a rubber exercise band/tubing so that it is at the height of your shoulders when you are either standing or sitting on a firm arm-less chair. With a palm-down grip, grasp an end of the band/tubing in each hand. Straighten your elbows and lift your hands straight in front of you  at shoulder height. Step back away from the secured end of band/tubing until it becomes tense. Squeezing your shoulder blades together, draw your elbows back as you bend them. Keep your upper arm lifted away from your body throughout the exercise. Hold 3 seconds. Slowly ease the tension on the band/tubing as you reverse the directions and return to the starting position. Repeat 2 times. Complete this exercise 3 times per week.  STRENGTH - Scapular Depressors Find a sturdy chair without wheels, such as a from a dining room table. Keeping your feet on the floor, lift your bottom from the seat and lock your elbows. Keeping your elbows straight, allow gravity to pull your body weight down. Your shoulders will rise toward your ears. Raise your body against gravity by drawing your shoulder blades down your back, shortening the distance between your shoulders and ears. Although your feet should always maintain contact with the floor, your feet should progressively support less body weight as you get stronger. Hold 3 seconds. In a controlled and slow manner, lower your body weight to begin the next repetition. Repeat 2 times. Complete this exercise 3 times per week.    This information is not intended to replace advice given to you by your health care provider. Make sure you discuss any questions you have with your health  care provider.   Document Released: 02/26/2005 Document Revised: 05/05/2014 Document Reviewed: 07/27/2008 [employer] Interactive Patient Education [employer]

## 2023-01-12 NOTE — Progress Notes (Signed)
Chief Complaint  Patient presents with   Annual Exam   Arthritis    Knee and back pain from a fall Moving around it hurts     Well Woman Dawn Schwartz is here for a complete physical.   Her last physical was >1 year ago.  She is here with the aid of a [place] interpreter. Current diet: in general, a "healthy" diet. Current exercise: wt lifting, stretching. Weight is stable and she denies daytime fatigue. Seatbelt? Yes Advanced directive? No  Health Maintenance Colonoscopy- Yes Shingrix- Yes DEXA- Yes Mammogram- Yes Tetanus- Yes Pneumonia- Yes Hep C screen- Yes  Fall 3 mo ago sustained a fall and landed on knees and an outstretched area.  She has had slight decreased range of motion and pain in her left shoulder in addition to pain in both of her knees, worse on the left.  The pain of the left shoulder is over the trapezius and deep over the top of the shoulder.  No redness, bruising, swelling, or neurologic signs or symptoms.  She has tried ice at home.  The left knee hurts on the inside.  There is no swelling, bruising, redness.  She has some catching.  It is affecting her ability to walk.  Itching Over the past 3 months also, the patient has had itching over her entire body.  Sometimes she will have a pinkish rash that eventually goes away.  She used to be on levocetirizine.  She had some leftover which did help.  No new lotions, soaps, topicals, or detergents.  Past Medical History:  Diagnosis Date   Aortic atherosclerosis (HCC)    Arthritis    Diabetes mellitus type 2 in nonobese [facility])    Essential hypertension    History of Helicobacter pylori infection    Hypercholesterolemia    Weight loss      Past Surgical History:  Procedure Laterality Date   NO PAST SURGERIES      Medications  Current Outpatient Medications on File Prior to Visit  Medication Sig Dispense Refill   Alcohol Swabs (B-D SINGLE USE SWABS REGULAR) PADS Use daily to check blood sugar.  DX E11.9 100  each 6   aspirin EC 81 MG tablet Take 81 mg by mouth daily.     atorvastatin (LIPITOR) 40 MG tablet TAKE 1 TABLET BY MOUTH ONCE DAILY 90 tablet 3   Blood Glucose Monitoring Suppl (ONE TOUCH ULTRA 2) w/Device KIT Use once daily to check blood sugar.  DX E11.9 1 kit 0   chlorthalidone (HYGROTON) 25 MG tablet Take 1 tablet (25 mg total) by mouth daily. 90 tablet 3   glucose blood (ONETOUCH ULTRA) test strip Use once daily to check blood sugar 100 each 12   Lancets (ONETOUCH ULTRASOFT) lancets Use once daily to check blood sugar. 100 each 12   losartan (COZAAR) 100 MG tablet Take 1 tablet (100 mg total) by mouth daily. 90 tablet 3   metFORMIN (GLUCOPHAGE) 1000 MG tablet Take 1 tablet (1,000 mg total) by mouth 2 (two) times daily with a meal. 180 tablet 3   Multiple Vitamin (MULTIVITAMIN) tablet Take 1 tablet by mouth daily.     pantoprazole (PROTONIX) 40 MG tablet Take 1 tablet (40 mg total) by mouth 2 (two) times daily. 180 tablet 3   sucralfate (CARAFATE) 1 g tablet Take 1 tablet (1 g total) by mouth 2 (two) times daily. 28 tablet 0   terbinafine (LAMISIL) 250 MG tablet Take 1 tablet (250 mg total) by mouth  daily. 90 tablet 2   Allergies No Known Allergies  Review of Systems: Constitutional:  no fevers Eye:  no recent significant change in vision Ears:  No changes in hearing Nose/Mouth/Throat:  no complaints of nasal congestion, no sore throat Cardiovascular: no chest pain Respiratory:  No shortness of breath Gastrointestinal:  No change in bowel habits GU:  Female: negative for dysuria Integumentary: As noted in HPI MSK: As noted in HPI Neurologic:  no headaches Endocrine:  denies unexplained weight changes  Exam BP 121/71 (BP Location: Right Arm, Patient Position: Sitting, Cuff Size: Normal)   Pulse 64   Temp 98 F (36.7 C) (Oral)   Ht 5\' 2"  (1.575 m)   Wt 110 lb (49.9 kg)   SpO2 98%   BMI 20.12 kg/m  General:  well developed, well nourished, in no apparent distress Skin:  Pinkish petechial rash over the right anterior thigh; it does blanch, but is without erythema, excessive warmth, fluctuance, drainage; otherwise no significant moles, warts, or growths on exposed skin Head:  no masses, lesions, or tenderness Eyes:  pupils equal and round, sclera anicteric without injection Ears:  canals without lesions, TMs shiny without retraction, no obvious effusion, no erythema Nose:  nares patent, mucosa normal, and no drainage Throat/Pharynx:  lips and gingiva without lesion; tongue and uvula midline; non-inflamed pharynx; no exudates or postnasal drainage Neck: neck supple without adenopathy, thyromegaly, or masses Lungs:  clear to auscultation, breath sounds equal bilaterally, no respiratory distress Cardio:  regular rate and rhythm, no bruits or LE edema Abdomen:  abdomen soft, nontender; bowel sounds normal; no masses or organomegaly Genital: Deferred MSK: Right knee exam was unremarkable, left shoulder with slightly decreased forward flexion passive/active, TTP over the left trapezius, positive Neer's, Hawkins, empty can; negative liftoff, crossover, O'Briens, speeds Left knee: Normal active and passive range of motion.  There is no effusion, ecchymosis, erythema, excessive warmth or edema.  There is tenderness over the medial plica on the left.  There is no joint line tenderness.  Negative Lachman's, varus/valgus stress, patellar apprehension/grind, Apley grind, McMurray's Neuro:  gait antalgic; deep tendon reflexes normal and symmetric Psych: well oriented with normal range of affect and appropriate judgment/insight  Assessment and Plan  Well adult exam  Estrogen deficiency - Plan: DG Bone Density  Pruritic rash - Plan: levocetirizine (XYZAL) 5 MG tablet  Nodule of finger of right hand  Plica of knee, left - Plan: meloxicam (MOBIC) 15 MG tablet  Chronic left shoulder pain - Plan: meloxicam (MOBIC) 15 MG tablet   Well 77 y.o. female. Counseled on diet and  exercise. Advanced directive form provided today.  Rash: Get back on PO antihistamine. Avoid scented products.  Chronic shoulder/knee pain: Chronic, uncontrolled.  Meloxicam daily as above.  Stretches and exercises for both shoulder and knee provided.  Follow-up in 1 month to recheck.  Will consider injections versus physical therapy versus sports medicine referral if no improvement. She has apparent arthritis of her hands. Need to update her bone density scan as it has been 5 years.  Last reading in 2019 was normal. The patient, through the interpreter, voiced understanding and agreement to the plan.  Jilda Roche [place], DO 01/12/23 10:56 AM

## 2023-01-19 ENCOUNTER — Other Ambulatory Visit: Payer: Self-pay | Admitting: Family Medicine

## 2023-01-19 ENCOUNTER — Ambulatory Visit (HOSPITAL_BASED_OUTPATIENT_CLINIC_OR_DEPARTMENT_OTHER)
Admission: RE | Admit: 2023-01-19 | Discharge: 2023-01-19 | Disposition: A | Discharge status: Home / Self Care | Payer: 59 | Source: Ambulatory Visit | Attending: Family Medicine | Admitting: Family Medicine

## 2023-01-19 ENCOUNTER — Other Ambulatory Visit (HOSPITAL_BASED_OUTPATIENT_CLINIC_OR_DEPARTMENT_OTHER): Payer: Self-pay

## 2023-01-19 DIAGNOSIS — Z780 Asymptomatic menopausal state: Secondary | ICD-10-CM | POA: Diagnosis not present

## 2023-01-19 DIAGNOSIS — E2839 Other primary ovarian failure: Secondary | ICD-10-CM | POA: Diagnosis present

## 2023-01-19 MED ORDER — ATORVASTATIN CALCIUM 40 MG PO TABS
40.0000 mg | ORAL_TABLET | Freq: Every day | ORAL | 3 refills | Status: DC
  Filled 2023-01-19: qty 90, 90d supply, fill #0
  Filled 2023-04-27: qty 90, 90d supply, fill #1
  Filled 2023-07-29: qty 90, 90d supply, fill #2
  Filled 2023-10-28 (×2): qty 90, 90d supply, fill #3

## 2023-01-23 ENCOUNTER — Ambulatory Visit (INDEPENDENT_AMBULATORY_CARE_PROVIDER_SITE_OTHER): Payer: 59

## 2023-01-23 ENCOUNTER — Other Ambulatory Visit (HOSPITAL_BASED_OUTPATIENT_CLINIC_OR_DEPARTMENT_OTHER): Payer: Self-pay

## 2023-01-23 DIAGNOSIS — Z23 Encounter for immunization: Secondary | ICD-10-CM

## 2023-01-23 MED ORDER — COMIRNATY 30 MCG/0.3ML IM SUSY
PREFILLED_SYRINGE | INTRAMUSCULAR | 0 refills | Status: DC
  Filled 2023-01-23: qty 0.3, 1d supply, fill #0

## 2023-02-03 ENCOUNTER — Other Ambulatory Visit (HOSPITAL_BASED_OUTPATIENT_CLINIC_OR_DEPARTMENT_OTHER): Payer: Self-pay

## 2023-02-13 ENCOUNTER — Other Ambulatory Visit (HOSPITAL_BASED_OUTPATIENT_CLINIC_OR_DEPARTMENT_OTHER): Payer: Self-pay

## 2023-02-13 ENCOUNTER — Ambulatory Visit (INDEPENDENT_AMBULATORY_CARE_PROVIDER_SITE_OTHER): Payer: 59 | Admitting: Family Medicine

## 2023-02-13 ENCOUNTER — Encounter: Payer: Self-pay | Admitting: Family Medicine

## 2023-02-13 VITALS — BP 120/80 | HR 70 | Temp 98.6°F | Ht 62.0 in | Wt 112.4 lb

## 2023-02-13 DIAGNOSIS — W57XXXA Bitten or stung by nonvenomous insect and other nonvenomous arthropods, initial encounter: Secondary | ICD-10-CM | POA: Diagnosis not present

## 2023-02-13 DIAGNOSIS — S50862A Insect bite (nonvenomous) of left forearm, initial encounter: Secondary | ICD-10-CM

## 2023-02-13 DIAGNOSIS — R29898 Other symptoms and signs involving the musculoskeletal system: Secondary | ICD-10-CM | POA: Diagnosis not present

## 2023-02-13 MED ORDER — TRIAMCINOLONE ACETONIDE 0.1 % EX CREA
1.0000 | TOPICAL_CREAM | Freq: Two times a day (BID) | CUTANEOUS | 0 refills | Status: DC
Start: 2023-02-13 — End: 2023-08-24
  Filled 2023-02-13: qty 30, 15d supply, fill #0

## 2023-02-13 MED ORDER — ESOMEPRAZOLE MAGNESIUM 40 MG PO CPDR
40.0000 mg | DELAYED_RELEASE_CAPSULE | Freq: Every day | ORAL | 3 refills | Status: DC
  Filled 2023-02-13: qty 90, 90d supply, fill #0
  Filled 2023-06-26: qty 90, 90d supply, fill #1

## 2023-02-13 NOTE — Patient Instructions (Addendum)
OK to take Tylenol 1000 mg (2 extra strength tabs) or 975 mg (3 regular strength tabs) every 6 hours as needed.  Ice/cold pack over area for 10-15 min twice daily.  Heat (pad or rice pillow in microwave) over affected area, 10-15 minutes twice daily.   Do strengthening exercises for your legs for the next few weeks. If no improvement in the next 3-4 weeks, let me know and we will set you up with physical therapy.   I am a bit concerned this could be bed bugs. Make sure to put your linens and clothes through the wash and a dryer cycle.   Try not to scratch as this can make things worse. Avoid scented products while dealing with this. You may resume when the itchiness resolves. Cold/cool compresses can help.   Let us know if you need anything.

## 2023-02-13 NOTE — Progress Notes (Signed)
Chief Complaint  Patient presents with   Follow-up   Rash    Subjective: Patient is a 77 y.o. female here for f/u. Here w aid of [place] interpreter.   Seen 1 mo ago for knee/shoulder pain. Placed on Mobic 15 mg/d which did help, affected her stomach. She was compliant with the stretches. Exercising/strengthening 2 times per week only. Feels unstable tough pain is improved. No falls since last visit.   10 d ago started getting an itchy rash on her L forearm and L leg area. No new soaps, topicals, lotions, detergents, sick contacts. No pain or drainage. She used some left over Kenalog 0.1% which did help.   Past Medical History:  Diagnosis Date   Aortic atherosclerosis (HCC)    Arthritis    Diabetes mellitus type 2 in nonobese [facility])    Essential hypertension    History of Helicobacter pylori infection    Hypercholesterolemia    Weight loss     Objective: BP 120/80 (BP Location: Left Arm, Patient Position: Sitting, Cuff Size: Normal)   Pulse 70   Temp 98.6 F (37 C) (Oral)   Ht 5\' 2"  (1.575 m)   Wt 112 lb 6 oz (51 kg)   SpO2 98%   BMI 20.55 kg/m  General: Awake, appears stated age MSK: no ttp over lumbar region, no atrophy Skin: red patches/macules with some confluens over anterior left lower extremity, lateral volar forearm surface, central excoriations appreciated, no other drainage, excessive warmth, fluctuance Neuro: 2+ patellar reflexes b/l, 5/5 strength in RLE, 5/5 strength with knee extension and hip flexion on L, 4/5 strength with L knee flexion; gait is slow/cautious Lungs: No accessory muscle use Psych: Age appropriate judgment and insight, normal affect and mood  Assessment and Plan: Weakness of both lower extremities  Insect bite, unspecified site, initial encounter - Plan: triamcinolone cream (KENALOG) 0.1 %  Doubt back involvement at this time. No falls in past mo. Question deconditioning as she hasn't been doing as much 2/2 pain. Now that pain is resolved,  she will focus on home exercises to build back up her strength. She was told multiple times today to reach out in 3-4 weeks if she is not improving so we can get her in with PT. Sooner if something changes for the worse.  Looks like bed bugs. Put clothing/linens through wash/dryer cycle. Steroid cream as above. Try not to scratch and avoid scented products.  F/u in 5 mo for DM visit.  The patient voiced understanding and agreement to the plan.  Jilda Roche [place], DO 02/13/23  9:56 AM

## 2023-02-20 ENCOUNTER — Other Ambulatory Visit (HOSPITAL_BASED_OUTPATIENT_CLINIC_OR_DEPARTMENT_OTHER): Payer: Self-pay

## 2023-03-06 ENCOUNTER — Encounter: Payer: Self-pay | Admitting: Family Medicine

## 2023-03-11 ENCOUNTER — Other Ambulatory Visit (HOSPITAL_BASED_OUTPATIENT_CLINIC_OR_DEPARTMENT_OTHER): Payer: Self-pay

## 2023-03-16 ENCOUNTER — Encounter: Payer: Self-pay | Admitting: Gastroenterology

## 2023-03-16 ENCOUNTER — Other Ambulatory Visit: Payer: Self-pay

## 2023-03-16 ENCOUNTER — Other Ambulatory Visit: Payer: Self-pay | Admitting: Family Medicine

## 2023-03-16 ENCOUNTER — Other Ambulatory Visit (HOSPITAL_BASED_OUTPATIENT_CLINIC_OR_DEPARTMENT_OTHER): Payer: Self-pay

## 2023-03-16 DIAGNOSIS — E1165 Type 2 diabetes mellitus with hyperglycemia: Secondary | ICD-10-CM

## 2023-03-16 DIAGNOSIS — R7309 Other abnormal glucose: Secondary | ICD-10-CM

## 2023-03-16 MED ORDER — ONETOUCH ULTRA VI STRP
ORAL_STRIP | 5 refills | Status: DC
  Filled 2023-03-16: qty 100, fill #0
  Filled 2023-04-27: qty 100, 100d supply, fill #0
  Filled 2023-11-04: qty 100, 100d supply, fill #1
  Filled 2024-01-06 – 2024-02-03 (×2): qty 100, 100d supply, fill #2

## 2023-03-16 MED ORDER — ONETOUCH ULTRA 2 W/DEVICE KIT
1.0000 | PACK | Freq: Every day | 0 refills | Status: DC
  Filled 2023-03-16: qty 1, 30d supply, fill #0

## 2023-03-16 MED ORDER — ONETOUCH DELICA PLUS LANCET33G MISC
12 refills | Status: DC
Start: 2023-03-16 — End: 2024-02-03
  Filled 2023-03-16: qty 100, fill #0
  Filled 2023-04-27: qty 100, 90d supply, fill #0

## 2023-03-16 MED ORDER — MELOXICAM 15 MG PO TABS
15.0000 mg | ORAL_TABLET | Freq: Every day | ORAL | 0 refills | Status: DC
  Filled 2023-03-16: qty 30, 30d supply, fill #0

## 2023-04-27 ENCOUNTER — Other Ambulatory Visit (HOSPITAL_BASED_OUTPATIENT_CLINIC_OR_DEPARTMENT_OTHER): Payer: Self-pay

## 2023-05-14 ENCOUNTER — Other Ambulatory Visit (HOSPITAL_BASED_OUTPATIENT_CLINIC_OR_DEPARTMENT_OTHER): Payer: Self-pay

## 2023-05-25 ENCOUNTER — Other Ambulatory Visit (HOSPITAL_BASED_OUTPATIENT_CLINIC_OR_DEPARTMENT_OTHER): Payer: Self-pay

## 2023-06-26 ENCOUNTER — Other Ambulatory Visit: Payer: Self-pay | Admitting: Family Medicine

## 2023-06-26 ENCOUNTER — Other Ambulatory Visit (HOSPITAL_BASED_OUTPATIENT_CLINIC_OR_DEPARTMENT_OTHER): Payer: Self-pay

## 2023-06-26 MED ORDER — MELOXICAM 15 MG PO TABS
15.0000 mg | ORAL_TABLET | Freq: Every day | ORAL | 0 refills | Status: DC
  Filled 2023-06-26: qty 30, 30d supply, fill #0

## 2023-06-29 ENCOUNTER — Other Ambulatory Visit (HOSPITAL_BASED_OUTPATIENT_CLINIC_OR_DEPARTMENT_OTHER): Payer: Self-pay

## 2023-06-29 ENCOUNTER — Other Ambulatory Visit: Payer: Self-pay

## 2023-07-09 ENCOUNTER — Ambulatory Visit: Payer: Self-pay | Admitting: Family Medicine

## 2023-07-09 NOTE — Telephone Encounter (Signed)
 We can see her tomorrow at 730 or 745. Ty.

## 2023-07-09 NOTE — Telephone Encounter (Signed)
 Copied from CRM [id number]. Topic: Clinical - Red Word Triage >> Jul 09, 2023 11:05 AM Deaijah H wrote: Red Word that prompted transfer to Nurse Triage: Knee pain   Chief Complaint: Knee pain  Symptoms: Left knee pain  Frequency: Constant  Disposition: [] ED /[] Urgent Care (no appt availability in office) / [x] Appointment(In office/virtual)/ []  [facility]/ [] Home Care/ [] Refused Recommended Disposition /[] [facility]/ []  Follow-up with PCP Additional Notes: Patient's son called to make an appointment for the patient who has been having worsening left knee pain over the last 2 weeks. He states that her pain is causing her some difficulty walking. He states she has had no other complaints that he is aware of. Appointment made for the patient on Monday for evaluation. Patient's son instructed to call back for new or worsening symptoms. He verbalized understanding and agreement with this plan.     Reason for Disposition  [1] MODERATE pain (e.g., interferes with normal activities, limping) AND [2] present > 3 days  Answer Assessment - Initial Assessment Questions 1. LOCATION and RADIATION: "Where is the pain located?"      Left 2. QUALITY: "What does the pain feel like?"  (e.g., sharp, dull, aching, burning)     Unable to assess 3. SEVERITY: "How bad is the pain?" "What does it keep you from doing?"   (Scale 1-10; or mild, moderate, severe)   -  MILD (1-3): doesn't interfere with normal activities    -  MODERATE (4-7): interferes with normal activities (e.g., work or school) or awakens from sleep, limping    -  SEVERE (8-10): excruciating pain, unable to do any normal activities, unable to walk     Moderate  4. ONSET: "When did the pain start?" "Does it come and go, or is it there all the time?"     2 weeks  5. RECURRENT: "Have you had this pain before?" If Yes, ask: "When, and what happened then?"     Yes 6. SETTING: "Has there been any recent work, exercise or other  activity that involved that part of the body?"      No 7. AGGRAVATING FACTORS: "What makes the knee pain worse?" (e.g., walking, climbing stairs, running)     No 8. ASSOCIATED SYMPTOMS: "Is there any swelling or redness of the knee?"     No 9. OTHER SYMPTOMS: "Do you have any other symptoms?" (e.g., chest pain, difficulty breathing, fever, calf pain)     No  Protocols used: Knee Pain-A-AH

## 2023-07-13 ENCOUNTER — Ambulatory Visit

## 2023-07-13 ENCOUNTER — Other Ambulatory Visit (HOSPITAL_BASED_OUTPATIENT_CLINIC_OR_DEPARTMENT_OTHER): Payer: Self-pay

## 2023-07-13 ENCOUNTER — Encounter: Payer: Self-pay | Admitting: Family Medicine

## 2023-07-13 ENCOUNTER — Ambulatory Visit (INDEPENDENT_AMBULATORY_CARE_PROVIDER_SITE_OTHER): Admitting: Family Medicine

## 2023-07-13 VITALS — BP 138/86 | HR 78 | Ht 62.0 in | Wt 113.8 lb

## 2023-07-13 DIAGNOSIS — M25561 Pain in right knee: Secondary | ICD-10-CM

## 2023-07-13 DIAGNOSIS — M1711 Unilateral primary osteoarthritis, right knee: Secondary | ICD-10-CM | POA: Diagnosis not present

## 2023-07-13 DIAGNOSIS — M25562 Pain in left knee: Secondary | ICD-10-CM

## 2023-07-13 DIAGNOSIS — G8929 Other chronic pain: Secondary | ICD-10-CM

## 2023-07-13 DIAGNOSIS — M1712 Unilateral primary osteoarthritis, left knee: Secondary | ICD-10-CM | POA: Diagnosis not present

## 2023-07-13 MED ORDER — CELECOXIB 100 MG PO CAPS
100.0000 mg | ORAL_CAPSULE | Freq: Two times a day (BID) | ORAL | 0 refills | Status: DC
  Filled 2023-07-13: qty 60, 30d supply, fill #0

## 2023-07-13 NOTE — Patient Instructions (Addendum)
 Please get your X-ray done at the [facility] in [place]: [address], [place], [place] [address] [phone]  You do not need an appointment for this location.   Ice/cold pack over area for 10-15 min twice daily.  For the muscle cramping, drink lots of fluids. Also take a spoonful of pickle juice nightly. An alternative would be a teaspoon of mustard, but most people prefer pickle juice.   OK to take Tylenol 1000 mg (2 extra strength tabs) or 975 mg (3 regular strength tabs) every 6 hours as needed.  Let us know if you need anything.  Knee Exercises It is normal to feel mild stretching, pulling, tightness, or discomfort as you do these exercises, but you should stop right away if you feel sudden pain or your pain gets worse.  STRETCHING AND RANGE OF MOTION EXERCISES  These exercises warm up your muscles and joints and improve the movement and flexibility of your knee. These exercises also help to relieve pain, numbness, and tingling. Exercise A: Knee Extension, Prone  Lie on your abdomen on a bed. Place your left / right knee just beyond the edge of the surface so your knee is not on the bed. You can put a towel under your left / right thigh just above your knee for comfort. Relax your leg muscles and allow gravity to straighten your knee. You should feel a stretch behind your left / right knee. Hold this position for 30 seconds. Scoot up so your knee is supported between repetitions. Repeat 2 times. Complete this stretch 3 times per week. Exercise B: Knee Flexion, Active     Lie on your back with both knees straight. If this causes back discomfort, bend your left / right knee so your foot is flat on the floor. Slowly slide your left / right heel back toward your buttocks until you feel a gentle stretch in the front of your knee or thigh. Hold this position for 30 seconds. Slowly slide your left / right heel back to the starting position. Repeat 2 times. Complete this  exercise 3 times per week. Exercise C: Quadriceps, Prone     Lie on your abdomen on a firm surface, such as a bed or padded floor. Bend your left / right knee and hold your ankle. If you cannot reach your ankle or pant leg, loop a belt around your foot and grab the belt instead. Gently pull your heel toward your buttocks. Your knee should not slide out to the side. You should feel a stretch in the front of your thigh and knee. Hold this position for 30 seconds. Repeat 2 times. Complete this stretch 3 times per week. Exercise D: Hamstring, Supine  Lie on your back. Loop a belt or towel over the ball of your left / right foot. The ball of your foot is on the walking surface, right under your toes. Straighten your left / right knee and slowly pull on the belt to raise your leg until you feel a gentle stretch behind your knee. Do not let your left / right knee bend while you do this. Keep your other leg flat on the floor. Hold this position for 30 seconds. Repeat 2 times. Complete this stretch 3 times per week. STRENGTHENING EXERCISES  These exercises build strength and endurance in your knee. Endurance is the ability to use your muscles for a long time, even after they get tired. Exercise E: Quadriceps, Isometric     Lie on your back  with your left / right leg extended and your other knee bent. Put a rolled towel or small pillow under your knee if told by your health care provider. Slowly tense the muscles in the front of your left / right thigh. You should see your kneecap slide up toward your hip or see increased dimpling just above the knee. This motion will push the back of the knee toward the floor. For 3 seconds, keep the muscle as tight as you can without increasing your pain. Relax the muscles slowly and completely. Repeat for 10 total reps Repeat 2 ti mes. Complete this exercise 3 times per week. Exercise F: Straight Leg Raises - Quadriceps  Lie on your back with your left / right  leg extended and your other knee bent. Tense the muscles in the front of your left / right thigh. You should see your kneecap slide up or see increased dimpling just above the knee. Your thigh may even shake a bit. Keep these muscles tight as you raise your leg 4-6 inches (10-15 cm) off the floor. Do not let your knee bend. Hold this position for 3 seconds. Keep these muscles tense as you lower your leg. Relax your muscles slowly and completely after each repetition. 10 total reps. Repeat 2 times. Complete this exercise 3 times per week.  Exercise G: Hamstring Curls     If told by your health care provider, do this exercise while wearing ankle weights. Begin with 5 lb weights (optional). Then increase the weight by 1 lb (0.5 kg) increments. Do not wear ankle weights that are more than 20 lbs to start with. Lie on your abdomen with your legs straight. Bend your left / right knee as far as you can without feeling pain. Keep your hips flat against the floor. Hold this position for 3 seconds. Slowly lower your leg to the starting position. Repeat for 10 reps.  Repeat 2 times. Complete this exercise 3 times per week. Exercise H: Squats (Quadriceps)  Stand in front of a table, with your feet and knees pointing straight ahead. You may rest your hands on the table for balance but not for support. Slowly bend your knees and lower your hips like you are going to sit in a chair. Keep your weight over your heels, not over your toes. Keep your lower legs upright so they are parallel with the table legs. Do not let your hips go lower than your knees. Do not bend lower than told by your health care provider. If your knee pain increases, do not bend as low. Hold the squat position for 1 second. Slowly push with your legs to return to standing. Do not use your hands to pull yourself to standing. Repeat 2 times. Complete this exercise 3 times per week. Exercise I: Wall Slides (Quadriceps)     Lean your  back against a smooth wall or door while you walk your feet out 18-24 inches (46-61 cm) from it. Place your feet hip-width apart. Slowly slide down the wall or door until your knees Repeat 2 times. Complete this exercise every other day. Exercise K: Straight Leg Raises - Hip Abductors  Lie on your side with your left / right leg in the top position. Lie so your head, shoulder, knee, and hip line up. You may bend your bottom knee to help you keep your balance. Roll your hips slightly forward so your hips are stacked directly over each other and your left / right knee is facing  forward. Leading with your heel, lift your top leg 4-6 inches (10-15 cm). You should feel the muscles in your outer hip lifting. Do not let your foot drift forward. Do not let your knee roll toward the ceiling. Hold this position for 3 seconds. Slowly return your leg to the starting position. Let your muscles relax completely after each repetition. 10 total reps. Repeat 2 times. Complete this exercise 3 times per week. Exercise J: Straight Leg Raises - Hip Extensors  Lie on your abdomen on a firm surface. You can put a pillow under your hips if that is more comfortable. Tense the muscles in your buttocks and lift your left / right leg about 4-6 inches (10-15 cm). Keep your knee straight as you lift your leg. Hold this position for 3 seconds. Slowly lower your leg to the starting position. Let your leg relax completely after each repetition. Repeat 2 times. Complete this exercise 3 times per week. Document Released: 02/26/2005 Document Revised: 01/07/2016 Document Reviewed: 02/18/2015 Elsevier Interactive Patient Education  2017 [employer].

## 2023-07-13 NOTE — Progress Notes (Signed)
 Musculoskeletal Exam  Patient: Dawn Schwartz DOB: 07-22-1945  DOS: 07/13/2023  SUBJECTIVE:  Chief Complaint:   Chief Complaint  Patient presents with   Knee Pain    Patient presents today for right knee pain.    Dawn Schwartz How is a 78 y.o.  female for evaluation and treatment of bilateral pain. Here w son who helps interpret.   Onset:  1  yr  ago. No inj or change in activity.  Location: front of knees Character:  aching  Progression of issue:  has worsened Associated symptoms: difficulty walking No redness, bruising, swelling Treatment: to date has been rest, OTC NSAIDS, ice, Tylenol, and prescription NSAIDS.   Neurovascular symptoms: no  Past Medical History:  Diagnosis Date   Aortic atherosclerosis (HCC)    Arthritis    Diabetes mellitus type 2 in nonobese [facility])    Essential hypertension    History of Helicobacter pylori infection    Hypercholesterolemia    Weight loss     Objective: VITAL SIGNS: BP 138/86   Pulse 78   Ht 5\' 2"  (1.575 m)   Wt 113 lb 12.8 oz (51.6 kg)   SpO2 97%   BMI 20.81 kg/m  Constitutional: Well formed, well developed. No acute distress. Thorax & Lungs: No accessory muscle use Musculoskeletal: knees.   Normal active range of motion: yes.   Normal passive range of motion: yes Tenderness to palpation: yes over medial jt line on R Deformity: no Ecchymosis: no Tests positive: none Tests negative: Stine's, Lachman's, varus/valgus stress, patellar app/grind Neurologic: Normal sensory function. No focal deficits noted. DTR's equal and symmetric in LE's. No clonus. Psychiatric: Normal mood. Age appropriate judgment and insight. Alert & oriented x 3.    Assessment:  Chronic pain of both knees - Plan: DG Knee 3 Views Left, DG Knee 3 Views Right, celecoxib (CELEBREX) 100 MG capsule  Plan: Chronic, uncontrolled.  Stretches/exercises, heat, ice, Tylenol. Ck XR's of knees. Celebrex 100 mg twice daily as this could be easier on stomach.  F/u pending  results. The patient and her son voiced understanding and agreement to the plan.   Jilda Roche [place], DO 07/13/23  2:35 PM

## 2023-07-22 ENCOUNTER — Other Ambulatory Visit

## 2023-07-27 ENCOUNTER — Telehealth: Payer: Self-pay

## 2023-07-27 NOTE — Telephone Encounter (Signed)
 Copied from CRM [id number]. Topic: Clinical - Lab/Test Results >> Jul 27, 2023 11:37 AM Saverio Danker wrote: Reason for CRM: Patiens' sone steve is calling to get results on latest test his mom has taken  Cb#  [phone]

## 2023-07-29 ENCOUNTER — Other Ambulatory Visit (HOSPITAL_BASED_OUTPATIENT_CLINIC_OR_DEPARTMENT_OTHER): Payer: Self-pay

## 2023-07-29 NOTE — Telephone Encounter (Signed)
Returned son's call

## 2023-07-31 NOTE — Progress Notes (Signed)
 Patient's son Brett Canales was advised that form is ready for pick up

## 2023-08-13 ENCOUNTER — Telehealth: Payer: Self-pay | Admitting: *Deleted

## 2023-08-13 DIAGNOSIS — E1165 Type 2 diabetes mellitus with hyperglycemia: Secondary | ICD-10-CM

## 2023-08-13 NOTE — Telephone Encounter (Signed)
 Done, thx

## 2023-08-13 NOTE — Telephone Encounter (Signed)
 Pt has lab appt on Monday prior to OV with PCP on 4/28.  Can you place future orders or call pt to cancel if labs aren't needed at this time?

## 2023-08-17 ENCOUNTER — Other Ambulatory Visit: Payer: Self-pay | Admitting: Family Medicine

## 2023-08-17 ENCOUNTER — Other Ambulatory Visit (HOSPITAL_BASED_OUTPATIENT_CLINIC_OR_DEPARTMENT_OTHER): Payer: Self-pay

## 2023-08-17 ENCOUNTER — Other Ambulatory Visit (INDEPENDENT_AMBULATORY_CARE_PROVIDER_SITE_OTHER)

## 2023-08-17 DIAGNOSIS — E1165 Type 2 diabetes mellitus with hyperglycemia: Secondary | ICD-10-CM

## 2023-08-17 DIAGNOSIS — G8929 Other chronic pain: Secondary | ICD-10-CM

## 2023-08-17 LAB — COMPREHENSIVE METABOLIC PANEL WITH GFR
ALT: 20 U/L (ref 0–35)
AST: 17 U/L (ref 0–37)
Albumin: 5.1 g/dL (ref 3.5–5.2)
Alkaline Phosphatase: 46 U/L (ref 39–117)
BUN: 17 mg/dL (ref 6–23)
CO2: 31 meq/L (ref 19–32)
Calcium: 10.1 mg/dL (ref 8.4–10.5)
Chloride: 95 meq/L — ABNORMAL LOW (ref 96–112)
Creatinine, Ser: 0.59 mg/dL (ref 0.40–1.20)
GFR: 87.99 mL/min (ref >60.00)
Glucose, Bld: 135 mg/dL — ABNORMAL HIGH (ref 70–99)
Potassium: 3.9 meq/L (ref 3.5–5.1)
Sodium: 138 meq/L (ref 135–145)
Total Bilirubin: 0.6 mg/dL (ref 0.2–1.2)
Total Protein: 7.4 g/dL (ref 6.0–8.3)

## 2023-08-17 LAB — LIPID PANEL
Cholesterol: 127 mg/dL (ref 0–200)
HDL: 49.1 mg/dL (ref >39.00)
LDL Cholesterol: 56 mg/dL (ref 0–99)
NonHDL: 77.66
Total CHOL/HDL Ratio: 3
Triglycerides: 109 mg/dL (ref 0.0–149.0)
VLDL: 21.8 mg/dL (ref 0.0–40.0)

## 2023-08-17 LAB — HEMOGLOBIN A1C: Hgb A1c MFr Bld: 7.3 % — ABNORMAL HIGH (ref 4.6–6.5)

## 2023-08-17 MED ORDER — CELECOXIB 100 MG PO CAPS
100.0000 mg | ORAL_CAPSULE | Freq: Two times a day (BID) | ORAL | 0 refills | Status: DC
  Filled 2023-08-17: qty 60, 30d supply, fill #0

## 2023-08-24 ENCOUNTER — Ambulatory Visit (INDEPENDENT_AMBULATORY_CARE_PROVIDER_SITE_OTHER): Admitting: Family Medicine

## 2023-08-24 ENCOUNTER — Other Ambulatory Visit (HOSPITAL_BASED_OUTPATIENT_CLINIC_OR_DEPARTMENT_OTHER): Payer: Self-pay

## 2023-08-24 ENCOUNTER — Encounter: Payer: Self-pay | Admitting: Family Medicine

## 2023-08-24 VITALS — BP 136/84 | HR 73 | Temp 98.0°F | Resp 16 | Ht 62.0 in | Wt 112.0 lb

## 2023-08-24 DIAGNOSIS — M1710 Unilateral primary osteoarthritis, unspecified knee: Secondary | ICD-10-CM | POA: Insufficient documentation

## 2023-08-24 DIAGNOSIS — L282 Other prurigo: Secondary | ICD-10-CM

## 2023-08-24 DIAGNOSIS — K120 Recurrent oral aphthae: Secondary | ICD-10-CM | POA: Diagnosis not present

## 2023-08-24 DIAGNOSIS — R21 Rash and other nonspecific skin eruption: Secondary | ICD-10-CM

## 2023-08-24 MED ORDER — METRONIDAZOLE 0.75 % EX GEL
1.0000 | Freq: Two times a day (BID) | CUTANEOUS | 2 refills | Status: AC
  Filled 2023-08-24: qty 45, 23d supply, fill #0

## 2023-08-24 MED ORDER — ESOMEPRAZOLE MAGNESIUM 40 MG PO CPDR
40.0000 mg | DELAYED_RELEASE_CAPSULE | Freq: Every day | ORAL | 3 refills | Status: AC
  Filled 2023-08-24 – 2023-10-28 (×3): qty 90, 90d supply, fill #0
  Filled 2024-01-06: qty 90, 90d supply, fill #1
  Filled 2024-03-28: qty 90, 90d supply, fill #2

## 2023-08-24 MED ORDER — TRIAMCINOLONE ACETONIDE 0.1 % EX CREA
1.0000 | TOPICAL_CREAM | Freq: Two times a day (BID) | CUTANEOUS | 0 refills | Status: DC
  Filled 2023-08-24: qty 60, 30d supply, fill #0

## 2023-08-24 NOTE — Patient Instructions (Addendum)
 If you do not hear anything about your referral in the next 1-2 weeks, call our office and ask for an update.  Continue the stretches/exercises.   Heat (pad or rice pillow in microwave) over affected area, 10-15 minutes twice daily.   Ice/cold pack over area for 10-15 min twice daily.  OK to take Tylenol 1000 mg (2 extra strength tabs) or 975 mg (3 regular strength tabs) every 6 hours as needed.  Consider Orajel as needed for your canker sore.   Let us  know if you need anything.

## 2023-08-24 NOTE — Progress Notes (Signed)
 Chief Complaint  Patient presents with   Follow-up    Follow up    Subjective: Patient is a 78 y.o. female here for f/u b/l knee pain. Here w aid of [place] video interpreter.   Mod OA of both knees, worse on L. L medial knee pain. Compliant w stretches/exercises, Celebrex  helps. Things have not worsened. No catching/locking.   Over the past week, the patient has a sore on the inside of her mouth on the left.  She may have bitten the area but no obvious trigger.  Slightly better.  Salt water gargles have not been helpful.  She is wondering what she can take.  Over the past several years, she has had issues with a red rash on both of her cheeks.  It seems that her skin is thinning over the area.  No itching, raised areas, or pain.  No new lotions, soaps, topicals, or detergents.  Past Medical History:  Diagnosis Date   Aortic atherosclerosis (HCC)    Arthritis    Diabetes mellitus type 2 in nonobese [facility])    Essential hypertension    History of Helicobacter pylori infection    Hypercholesterolemia    Weight loss     Objective: BP 136/84 (BP Location: Left Arm, Patient Position: Sitting)   Pulse 73   Temp 98 F (36.7 C) (Oral)   Resp 16   Ht 5\' 2"  (1.575 m)   Wt 112 lb (50.8 kg)   SpO2 96%   BMI 20.49 kg/m  General: Awake, appears stated age Skin: No raised lesions, slight hyperemia over both cheeks.  No excessive warmth, drainage, fluctuance, excoriation. Lungs: No accessory muscle use Psych: Age appropriate judgment and insight, normal affect and mood  Assessment and Plan: Arthritis of knee - Plan: Ambulatory referral to Physical Therapy  Aphthous ulcer of mouth  Rash of face - Plan: metroNIDAZOLE (METROGEL) 0.75 % gel  Pruritic rash - Plan: triamcinolone  cream (KENALOG ) 0.1 %  Chronic issue, not controlled. Add PT. Cont Tylenol, ice, heat, stretches.  Orajel. Improving. Rosacea? Will tx w topical metronidazole bid. F/u in 2 mo to recheck this.  The patient, thru  the interpreter, voiced understanding and agreement to the plan.  Shellie Dials [place], DO 08/24/23  12:07 PM

## 2023-09-03 ENCOUNTER — Other Ambulatory Visit (HOSPITAL_BASED_OUTPATIENT_CLINIC_OR_DEPARTMENT_OTHER): Payer: Self-pay

## 2023-09-11 DIAGNOSIS — M25569 Pain in unspecified knee: Secondary | ICD-10-CM | POA: Diagnosis not present

## 2023-09-11 DIAGNOSIS — M25469 Effusion, unspecified knee: Secondary | ICD-10-CM | POA: Diagnosis not present

## 2023-09-11 DIAGNOSIS — R29898 Other symptoms and signs involving the musculoskeletal system: Secondary | ICD-10-CM | POA: Diagnosis not present

## 2023-09-11 DIAGNOSIS — R269 Unspecified abnormalities of gait and mobility: Secondary | ICD-10-CM | POA: Diagnosis not present

## 2023-09-15 DIAGNOSIS — R29898 Other symptoms and signs involving the musculoskeletal system: Secondary | ICD-10-CM | POA: Diagnosis not present

## 2023-09-15 DIAGNOSIS — M25569 Pain in unspecified knee: Secondary | ICD-10-CM | POA: Diagnosis not present

## 2023-09-15 DIAGNOSIS — M25469 Effusion, unspecified knee: Secondary | ICD-10-CM | POA: Diagnosis not present

## 2023-09-15 DIAGNOSIS — R269 Unspecified abnormalities of gait and mobility: Secondary | ICD-10-CM | POA: Diagnosis not present

## 2023-09-17 DIAGNOSIS — R29898 Other symptoms and signs involving the musculoskeletal system: Secondary | ICD-10-CM | POA: Diagnosis not present

## 2023-09-17 DIAGNOSIS — M25462 Effusion, left knee: Secondary | ICD-10-CM | POA: Diagnosis not present

## 2023-09-17 DIAGNOSIS — M25562 Pain in left knee: Secondary | ICD-10-CM | POA: Diagnosis not present

## 2023-09-17 DIAGNOSIS — R269 Unspecified abnormalities of gait and mobility: Secondary | ICD-10-CM | POA: Diagnosis not present

## 2023-09-24 DIAGNOSIS — R29898 Other symptoms and signs involving the musculoskeletal system: Secondary | ICD-10-CM | POA: Diagnosis not present

## 2023-09-24 DIAGNOSIS — M25569 Pain in unspecified knee: Secondary | ICD-10-CM | POA: Diagnosis not present

## 2023-09-24 DIAGNOSIS — M25469 Effusion, unspecified knee: Secondary | ICD-10-CM | POA: Diagnosis not present

## 2023-09-24 DIAGNOSIS — R269 Unspecified abnormalities of gait and mobility: Secondary | ICD-10-CM | POA: Diagnosis not present

## 2023-09-29 DIAGNOSIS — R269 Unspecified abnormalities of gait and mobility: Secondary | ICD-10-CM | POA: Diagnosis not present

## 2023-09-29 DIAGNOSIS — M25569 Pain in unspecified knee: Secondary | ICD-10-CM | POA: Diagnosis not present

## 2023-09-29 DIAGNOSIS — R29898 Other symptoms and signs involving the musculoskeletal system: Secondary | ICD-10-CM | POA: Diagnosis not present

## 2023-09-29 DIAGNOSIS — M25469 Effusion, unspecified knee: Secondary | ICD-10-CM | POA: Diagnosis not present

## 2023-10-01 DIAGNOSIS — R29898 Other symptoms and signs involving the musculoskeletal system: Secondary | ICD-10-CM | POA: Diagnosis not present

## 2023-10-01 DIAGNOSIS — R269 Unspecified abnormalities of gait and mobility: Secondary | ICD-10-CM | POA: Diagnosis not present

## 2023-10-01 DIAGNOSIS — M25569 Pain in unspecified knee: Secondary | ICD-10-CM | POA: Diagnosis not present

## 2023-10-01 DIAGNOSIS — M25469 Effusion, unspecified knee: Secondary | ICD-10-CM | POA: Diagnosis not present

## 2023-10-06 DIAGNOSIS — M25462 Effusion, left knee: Secondary | ICD-10-CM | POA: Diagnosis not present

## 2023-10-06 DIAGNOSIS — M25561 Pain in right knee: Secondary | ICD-10-CM | POA: Diagnosis not present

## 2023-10-06 DIAGNOSIS — M25562 Pain in left knee: Secondary | ICD-10-CM | POA: Diagnosis not present

## 2023-10-06 DIAGNOSIS — R29898 Other symptoms and signs involving the musculoskeletal system: Secondary | ICD-10-CM | POA: Diagnosis not present

## 2023-10-06 DIAGNOSIS — R269 Unspecified abnormalities of gait and mobility: Secondary | ICD-10-CM | POA: Diagnosis not present

## 2023-10-06 DIAGNOSIS — M25461 Effusion, right knee: Secondary | ICD-10-CM | POA: Diagnosis not present

## 2023-10-08 DIAGNOSIS — M25561 Pain in right knee: Secondary | ICD-10-CM | POA: Diagnosis not present

## 2023-10-08 DIAGNOSIS — R29898 Other symptoms and signs involving the musculoskeletal system: Secondary | ICD-10-CM | POA: Diagnosis not present

## 2023-10-08 DIAGNOSIS — M25562 Pain in left knee: Secondary | ICD-10-CM | POA: Diagnosis not present

## 2023-10-08 DIAGNOSIS — M25461 Effusion, right knee: Secondary | ICD-10-CM | POA: Diagnosis not present

## 2023-10-08 DIAGNOSIS — R269 Unspecified abnormalities of gait and mobility: Secondary | ICD-10-CM | POA: Diagnosis not present

## 2023-10-08 DIAGNOSIS — M25462 Effusion, left knee: Secondary | ICD-10-CM | POA: Diagnosis not present

## 2023-10-09 ENCOUNTER — Other Ambulatory Visit: Payer: Self-pay

## 2023-10-09 ENCOUNTER — Other Ambulatory Visit (HOSPITAL_BASED_OUTPATIENT_CLINIC_OR_DEPARTMENT_OTHER): Payer: Self-pay

## 2023-10-09 ENCOUNTER — Encounter: Payer: Self-pay | Admitting: Family Medicine

## 2023-10-09 ENCOUNTER — Ambulatory Visit (INDEPENDENT_AMBULATORY_CARE_PROVIDER_SITE_OTHER): Admitting: Family Medicine

## 2023-10-09 VITALS — BP 122/68 | HR 77 | Temp 98.0°F | Resp 16 | Ht 62.0 in | Wt 112.0 lb

## 2023-10-09 DIAGNOSIS — E1165 Type 2 diabetes mellitus with hyperglycemia: Secondary | ICD-10-CM

## 2023-10-09 DIAGNOSIS — M25561 Pain in right knee: Secondary | ICD-10-CM

## 2023-10-09 DIAGNOSIS — G8929 Other chronic pain: Secondary | ICD-10-CM

## 2023-10-09 DIAGNOSIS — M25562 Pain in left knee: Secondary | ICD-10-CM

## 2023-10-09 DIAGNOSIS — L719 Rosacea, unspecified: Secondary | ICD-10-CM | POA: Diagnosis not present

## 2023-10-09 MED ORDER — METHYLPREDNISOLONE ACETATE 40 MG/ML IJ SUSP
40.0000 mg | Freq: Once | INTRAMUSCULAR | Status: AC
  Administered 2023-10-09: 40 mg via INTRAMUSCULAR

## 2023-10-09 MED ORDER — CELECOXIB 100 MG PO CAPS
100.0000 mg | ORAL_CAPSULE | Freq: Two times a day (BID) | ORAL | 0 refills | Status: DC
  Filled 2023-10-09: qty 60, 30d supply, fill #0

## 2023-10-09 NOTE — Addendum Note (Signed)
 Addended by: Creed Dodrill on: 10/09/2023 10:59 AM   Modules accepted: Orders, Level of Service

## 2023-10-09 NOTE — Progress Notes (Signed)
 Chief Complaint  Patient presents with   Follow-up    Follow Up    Subjective: Patient is a 78 y.o. female here for f/u. Here w aid of [place] video interpreter.   Started on metro gel twice daily on her face. She reports some improvement. No AE's. Compliant. Does not wish to change therapy.  Chronic knee pain not better after a course of PT. XR's shows arthritis in both knees, worse on the L. It hurts when she walks. No catching/locking. She does not follow w ortho.   Past Medical History:  Diagnosis Date   Aortic atherosclerosis (HCC)    Arthritis    Diabetes mellitus type 2 in nonobese [facility])    Essential hypertension    History of Helicobacter pylori infection    Hypercholesterolemia    Weight loss     Objective: BP 122/68 (BP Location: Left Arm, Patient Position: Sitting)   Pulse 77   Temp 98 F (36.7 C) (Oral)   Resp 16   Ht 5' 2 (1.575 m)   Wt 112 lb (50.8 kg)   SpO2 98%   BMI 20.49 kg/m  General: Awake, appears stated age Skin: Slight hyperemia over cheeks, better than before. No edema or ttp.  Lungs: No accessory muscle use Psych: Age appropriate judgment and insight, normal affect and mood  Procedure Note; Knee injection Verbal consent obtained. The area of the antero-lateral joint line was palpated and cleaned with alcohol x1. A topical anesthetic was applied prior to injection. A 27-gauge needle was used to enter the joint space anterolaterally with ease. 40 mg of Depomedrol with 2 mL of 1% lidocaine  was injected. A Band-Aid was placed. This process was repeated on the contralateral side. The patient tolerated the procedure well. There were no complications noted.  Assessment and Plan: Rosacea  Bilateral chronic knee pain  Chronic, stable. Cont topical metronidazole  0.75% twice daily. Chronic, not stable.  Continue home exercise program.  Failed physical therapy.  Steroid injections today.  If not significantly improved in 1 week, she will send me a  message or call and we will set her up with the orthopedic surgery team. Follow-up for her physical at the end of October.  Labs 1 week prior. The patient through the interpreter voiced understanding and agreement to the plan.  Shellie Dials [place], DO 10/09/23  10:15 AM

## 2023-10-09 NOTE — Patient Instructions (Signed)
 OK to take Tylenol 1000 mg (2 extra strength tabs) or 975 mg (3 regular strength tabs) every 6 hours as needed.  Ice/cold pack over area for 10-15 min twice daily.  Heat (pad or rice pillow in microwave) over affected area, 10-15 minutes twice daily.   OK to take Tylenol 1000 mg (2 extra strength tabs) or 975 mg (3 regular strength tabs) every 6 hours as needed.  If things don't improve, please let me know and we can get you in with the next 1-2 weeks.   Let us  know if you need anything.

## 2023-10-28 ENCOUNTER — Other Ambulatory Visit (HOSPITAL_BASED_OUTPATIENT_CLINIC_OR_DEPARTMENT_OTHER): Payer: Self-pay

## 2023-10-28 ENCOUNTER — Other Ambulatory Visit: Payer: Self-pay | Admitting: Family Medicine

## 2023-10-28 MED ORDER — LOSARTAN POTASSIUM 100 MG PO TABS
100.0000 mg | ORAL_TABLET | Freq: Every day | ORAL | 3 refills | Status: AC
  Filled 2023-10-28: qty 90, 90d supply, fill #0
  Filled 2024-01-29: qty 90, 90d supply, fill #1
  Filled 2024-05-03: qty 90, 90d supply, fill #2

## 2023-11-04 ENCOUNTER — Other Ambulatory Visit: Payer: Self-pay

## 2023-11-04 ENCOUNTER — Other Ambulatory Visit (HOSPITAL_BASED_OUTPATIENT_CLINIC_OR_DEPARTMENT_OTHER): Payer: Self-pay

## 2023-11-04 ENCOUNTER — Other Ambulatory Visit: Payer: Self-pay | Admitting: Family Medicine

## 2023-11-04 MED ORDER — METFORMIN HCL 1000 MG PO TABS
1000.0000 mg | ORAL_TABLET | Freq: Two times a day (BID) | ORAL | 0 refills | Status: DC
  Filled 2023-11-04: qty 180, 90d supply, fill #0

## 2023-11-17 ENCOUNTER — Ambulatory Visit: Admitting: Physician Assistant

## 2023-11-19 ENCOUNTER — Other Ambulatory Visit (HOSPITAL_BASED_OUTPATIENT_CLINIC_OR_DEPARTMENT_OTHER): Payer: Self-pay

## 2023-11-19 ENCOUNTER — Other Ambulatory Visit: Payer: Self-pay

## 2023-11-19 ENCOUNTER — Encounter: Payer: Self-pay | Admitting: Family Medicine

## 2023-11-19 ENCOUNTER — Ambulatory Visit: Admitting: Family Medicine

## 2023-11-19 VITALS — BP 124/76 | HR 69 | Temp 98.0°F | Resp 16 | Ht 62.0 in | Wt 109.8 lb

## 2023-11-19 DIAGNOSIS — R09A2 Foreign body sensation, throat: Secondary | ICD-10-CM | POA: Diagnosis not present

## 2023-11-19 DIAGNOSIS — Z7984 Long term (current) use of oral hypoglycemic drugs: Secondary | ICD-10-CM | POA: Diagnosis not present

## 2023-11-19 DIAGNOSIS — K120 Recurrent oral aphthae: Secondary | ICD-10-CM | POA: Diagnosis not present

## 2023-11-19 DIAGNOSIS — E1165 Type 2 diabetes mellitus with hyperglycemia: Secondary | ICD-10-CM

## 2023-11-19 DIAGNOSIS — G8929 Other chronic pain: Secondary | ICD-10-CM

## 2023-11-19 MED ORDER — CHLORHEXIDINE GLUCONATE 0.12 % MT SOLN
OROMUCOSAL | 0 refills | Status: DC
  Filled 2023-11-19: qty 473, 16d supply, fill #0

## 2023-11-19 MED ORDER — CHLORTHALIDONE 25 MG PO TABS
25.0000 mg | ORAL_TABLET | Freq: Every day | ORAL | 3 refills | Status: AC
  Filled 2023-11-19: qty 90, 90d supply, fill #0
  Filled 2024-02-22 (×2): qty 90, 90d supply, fill #1
  Filled 2024-05-27: qty 90, 90d supply, fill #2

## 2023-11-19 MED ORDER — CELECOXIB 100 MG PO CAPS
100.0000 mg | ORAL_CAPSULE | Freq: Two times a day (BID) | ORAL | 0 refills | Status: DC
  Filled 2023-11-19: qty 60, 30d supply, fill #0

## 2023-11-19 MED ORDER — IBUPROFEN 800 MG PO TABS
800.0000 mg | ORAL_TABLET | Freq: Four times a day (QID) | ORAL | 0 refills | Status: DC | PRN
  Filled 2023-11-19: qty 12, 3d supply, fill #0

## 2023-11-19 NOTE — Patient Instructions (Signed)
 Give us  2-3 business days to get the results of your labs back.   Keep the diet clean and stay active.  The lesion in your mouth should steadily improve over the next few days.   Continue your Nexium .   Pepcid/famotidine 20 mg 1-2 times daily can help with reflux symptoms.   Let me know if your symptoms persist after this.   Let us  know if you need anything.

## 2023-11-19 NOTE — Progress Notes (Signed)
 Chief Complaint  Patient presents with   Oral Pain    Mouth Pain    Subjective: Patient is a 78 y.o. female here for pain in her mouth. Here w aid of [place] interpreter.   Had a dental procedure 10 d ago and started having pain on the inside of her lip. Trauma from dentist. Started taking amox 500 mg bid for 3 d and reports improvement slightly. She was given Orajel which was not helpful. No fevers. She ran out of amox and is requesting more. Associated lump in throat and slight difficulty swallowing since this started. No pain. She is taking Nexium  daily.   BS's have been running in the mid 100's since having a knee injection. Injection was done around 6 weeks ago. Did not have oral pain in June. Diet is limited 2/2 oral issue as above. No Cp or SOB.  She is taking metformin  1000 mg bid. Compliant, no AE's. Pt is walking for exercise.   Past Medical History:  Diagnosis Date   Aortic atherosclerosis (HCC)    Arthritis    Diabetes mellitus type 2 in nonobese [facility])    Essential hypertension    History of Helicobacter pylori infection    Hypercholesterolemia    Weight loss     Objective: BP 124/76 (BP Location: Left Arm, Patient Position: Sitting)   Pulse 69   Temp 98 F (36.7 C) (Oral)   Resp 16   Ht 5' 2 (1.575 m)   Wt 109 lb 12.8 oz (49.8 kg)   SpO2 96%   BMI 20.08 kg/m  General: Awake, appears stated age Mouth: MMM, over the lower anterior oral mucosa, there is a gray ulceration with surrounding erythema. Heart: RRR, no LE edema Lungs: CTAB, no rales, wheezes or rhonchi. No accessory muscle use Psych: Age appropriate judgment and insight, normal affect and mood  Assessment and Plan: Aphthous ulcer  Type 2 diabetes mellitus with hyperglycemia, without long-term current use of insulin (HCC) - Plan: Hemoglobin A1c, Hemoglobin A1c  Globus sensation  Reassurance. Cont supportive care. No need for abx. Do not take family member's abx in the future. Tylenol prn.  Ck A1c.  Counseled on diet/exercise. Cont metformin  1000 mg bid. Monitor sugars at home. Could be due to acute pain.  Cont Nexium . Add Pepcid 20 mg bid prn. Send message in 2 weeks if no better, will refer to ENT.  She also brought issues regarding her husband who also is int a patient.  Questions answered regarding this. F/u in 3 mo for CPE, labs 1 week before.  The patient thru the interpreter voiced understanding and agreement to the plan.  I spent 32 min w the pt discussing the above plans in addition to reviewing her chart onhee same day of the visit.   Dawn Schwartz [place], DO 11/19/23  3:21 PM

## 2024-01-06 ENCOUNTER — Other Ambulatory Visit (HOSPITAL_BASED_OUTPATIENT_CLINIC_OR_DEPARTMENT_OTHER): Payer: Self-pay

## 2024-01-06 ENCOUNTER — Other Ambulatory Visit: Payer: Self-pay | Admitting: Family Medicine

## 2024-01-06 DIAGNOSIS — L282 Other prurigo: Secondary | ICD-10-CM

## 2024-01-06 MED ORDER — TRIAMCINOLONE ACETONIDE 0.1 % EX CREA
1.0000 | TOPICAL_CREAM | Freq: Two times a day (BID) | CUTANEOUS | 0 refills | Status: AC
Start: 2024-01-06 — End: ?
  Filled 2024-01-06 – 2024-02-03 (×2): qty 60, 30d supply, fill #0

## 2024-01-20 ENCOUNTER — Other Ambulatory Visit (HOSPITAL_BASED_OUTPATIENT_CLINIC_OR_DEPARTMENT_OTHER): Payer: Self-pay

## 2024-01-29 ENCOUNTER — Other Ambulatory Visit (HOSPITAL_BASED_OUTPATIENT_CLINIC_OR_DEPARTMENT_OTHER): Payer: Self-pay

## 2024-01-29 ENCOUNTER — Other Ambulatory Visit: Payer: Self-pay

## 2024-01-29 ENCOUNTER — Other Ambulatory Visit: Payer: Self-pay | Admitting: Family Medicine

## 2024-01-29 DIAGNOSIS — G8929 Other chronic pain: Secondary | ICD-10-CM

## 2024-01-29 MED ORDER — CELECOXIB 100 MG PO CAPS
100.0000 mg | ORAL_CAPSULE | Freq: Two times a day (BID) | ORAL | 0 refills | Status: DC
  Filled 2024-01-29: qty 60, 30d supply, fill #0

## 2024-01-29 MED ORDER — ATORVASTATIN CALCIUM 40 MG PO TABS
40.0000 mg | ORAL_TABLET | Freq: Every day | ORAL | 3 refills | Status: AC
  Filled 2024-01-29 (×3): qty 90, 90d supply, fill #0
  Filled 2024-05-03: qty 90, 90d supply, fill #1

## 2024-01-29 MED ORDER — METFORMIN HCL 1000 MG PO TABS
1000.0000 mg | ORAL_TABLET | Freq: Two times a day (BID) | ORAL | 0 refills | Status: DC
  Filled 2024-01-29: qty 180, 90d supply, fill #0

## 2024-02-03 ENCOUNTER — Other Ambulatory Visit (HOSPITAL_BASED_OUTPATIENT_CLINIC_OR_DEPARTMENT_OTHER): Payer: Self-pay

## 2024-02-03 ENCOUNTER — Other Ambulatory Visit: Payer: Self-pay

## 2024-02-03 DIAGNOSIS — E1165 Type 2 diabetes mellitus with hyperglycemia: Secondary | ICD-10-CM

## 2024-02-03 MED ORDER — ACCU-CHEK SOFTCLIX LANCETS MISC
12 refills | Status: AC
  Filled 2024-02-03 – 2024-02-22 (×2): qty 100, 100d supply, fill #0

## 2024-02-03 MED ORDER — BLOOD GLUCOSE MONITOR SYSTEM W/DEVICE KIT
1.0000 | PACK | Freq: Every day | 0 refills | Status: AC
  Filled 2024-02-03 – 2024-02-22 (×2): qty 1, 30d supply, fill #0

## 2024-02-03 MED ORDER — ONETOUCH ULTRA VI STRP
ORAL_STRIP | 5 refills | Status: AC
  Filled 2024-02-03 – 2024-02-22 (×2): qty 100, 100d supply, fill #0

## 2024-02-09 NOTE — Progress Notes (Signed)
 Dawn Schwartz                                          MRN: [id number]   02/09/2024   The [facility] Quality Team Specialist reviewed this patient medical record for the purposes of chart review for care gap closure. The following were reviewed: chart review for care gap closure-kidney health evaluation for diabetes:eGFR  and uACR. Appt 10/20 for labs!    [facility] Quality Team

## 2024-02-15 ENCOUNTER — Ambulatory Visit: Payer: Self-pay | Admitting: Family Medicine

## 2024-02-15 ENCOUNTER — Other Ambulatory Visit

## 2024-02-15 DIAGNOSIS — E1165 Type 2 diabetes mellitus with hyperglycemia: Secondary | ICD-10-CM | POA: Diagnosis not present

## 2024-02-15 LAB — LIPID PANEL
Cholesterol: 139 mg/dL (ref 0–200)
HDL: 54.1 mg/dL (ref >39.00)
LDL Cholesterol: 57 mg/dL (ref 0–99)
NonHDL: 85.07
Total CHOL/HDL Ratio: 3
Triglycerides: 142 mg/dL (ref 0.0–149.0)
VLDL: 28.4 mg/dL (ref 0.0–40.0)

## 2024-02-15 LAB — COMPREHENSIVE METABOLIC PANEL WITH GFR
ALT: 32 U/L (ref 0–35)
AST: 23 U/L (ref 0–37)
Albumin: 5 g/dL (ref 3.5–5.2)
Alkaline Phosphatase: 50 U/L (ref 39–117)
BUN: 15 mg/dL (ref 6–23)
CO2: 31 meq/L (ref 19–32)
Calcium: 9.8 mg/dL (ref 8.4–10.5)
Chloride: 99 meq/L (ref 96–112)
Creatinine, Ser: 0.5 mg/dL (ref 0.40–1.20)
GFR: 91.25 mL/min (ref >60.00)
Glucose, Bld: 132 mg/dL — ABNORMAL HIGH (ref 70–99)
Potassium: 4.6 meq/L (ref 3.5–5.1)
Sodium: 139 meq/L (ref 135–145)
Total Bilirubin: 0.7 mg/dL (ref 0.2–1.2)
Total Protein: 7.2 g/dL (ref 6.0–8.3)

## 2024-02-15 LAB — CBC
HCT: 44 % (ref 36.0–46.0)
Hemoglobin: 14.5 g/dL (ref 12.0–15.0)
MCHC: 32.9 g/dL (ref 30.0–36.0)
MCV: 94.5 fl (ref 78.0–100.0)
Platelets: 196 K/uL (ref 150.0–400.0)
RBC: 4.65 Mil/uL (ref 3.87–5.11)
RDW: 13.2 % (ref 11.5–15.5)
WBC: 4.9 K/uL (ref 4.0–10.5)

## 2024-02-15 LAB — HEMOGLOBIN A1C: Hgb A1c MFr Bld: 7.6 % — ABNORMAL HIGH (ref 4.6–6.5)

## 2024-02-15 LAB — MICROALBUMIN / CREATININE URINE RATIO
Creatinine,U: 26 mg/dL
Microalb Creat Ratio: UNDETERMINED mg/g (ref 0.0–30.0)
Microalb, Ur: <0.7 mg/dL

## 2024-02-18 ENCOUNTER — Other Ambulatory Visit (HOSPITAL_BASED_OUTPATIENT_CLINIC_OR_DEPARTMENT_OTHER): Payer: Self-pay

## 2024-02-22 ENCOUNTER — Encounter: Payer: Self-pay | Admitting: Family Medicine

## 2024-02-22 ENCOUNTER — Ambulatory Visit: Admitting: Family Medicine

## 2024-02-22 ENCOUNTER — Other Ambulatory Visit (HOSPITAL_BASED_OUTPATIENT_CLINIC_OR_DEPARTMENT_OTHER): Payer: Self-pay

## 2024-02-22 VITALS — BP 122/72 | HR 68 | Temp 97.5°F | Resp 16 | Ht 62.0 in | Wt 111.4 lb

## 2024-02-22 DIAGNOSIS — E1165 Type 2 diabetes mellitus with hyperglycemia: Secondary | ICD-10-CM

## 2024-02-22 DIAGNOSIS — I10 Essential (primary) hypertension: Secondary | ICD-10-CM

## 2024-02-22 DIAGNOSIS — Z23 Encounter for immunization: Secondary | ICD-10-CM

## 2024-02-22 DIAGNOSIS — Z0000 Encounter for general adult medical examination without abnormal findings: Secondary | ICD-10-CM

## 2024-02-22 DIAGNOSIS — H6121 Impacted cerumen, right ear: Secondary | ICD-10-CM

## 2024-02-22 NOTE — Addendum Note (Signed)
 Addended by: Toan Mort M on: 02/22/2024 11:38 AM   Modules accepted: Orders

## 2024-02-22 NOTE — Patient Instructions (Addendum)
Keep the diet clean and stay active.  OK to use Debrox (peroxide) in the ear to loosen up wax. Also recommend using a bulb syringe (for removing boogers from baby's noses) to flush through warm water and vinegar (3-4:1 ratio). An alternative, though more expensive, is an elephant ear washer wax removal kit. Do not use Q-tips as this can impact wax further.  Let us know if you need anything.

## 2024-02-22 NOTE — Progress Notes (Signed)
 Chief Complaint  Patient presents with   Annual Exam    CPE     Well Woman Dawn Schwartz is here for a complete physical.  Here w aid of [place] interpreter.  Her last physical was >1 year ago.  Current diet: in general, a healthy diet. Current exercise: walking. Weight is stable and she denies daytime fatigue. Seatbelt? Yes Advanced directive? No  Health Maintenance Shingrix- Yes DEXA- Yes Mammogram- Yes Tetanus- Yes Pneumonia- Yes Hep C screen- Yes  R ear itchy. No pain, drainage, hearing loss. +hx of wax. Does not use Q tips.   Past Medical History:  Diagnosis Date   Aortic atherosclerosis    Arthritis    Diabetes mellitus type 2 in nonobese [facility])    Essential hypertension    History of Helicobacter pylori infection    Hypercholesterolemia    Weight loss      Past Surgical History:  Procedure Laterality Date   NO PAST SURGERIES      Medications  Current Outpatient Medications on File Prior to Visit  Medication Sig Dispense Refill   atorvastatin  (LIPITOR) 40 MG tablet Take 1 tablet by mouth once daily 90 tablet 3   Blood Glucose Monitoring Suppl (BLOOD GLUCOSE MONITOR SYSTEM) w/Device KIT 1 each by Other route daily. 1 kit 0   celecoxib  (CELEBREX ) 100 MG capsule Take 1 capsule (100 mg total) by mouth 2 (two) times daily. 60 capsule 0   chlorhexidine  (PERIDEX ) 0.12 % solution Rinse gently with capful once in the morning and once at night for 7 (seven) days. Spit out after rinsing. 473 mL 0   chlorthalidone  (HYGROTON ) 25 MG tablet Take 1 tablet (25 mg total) by mouth daily. 90 tablet 3   esomeprazole  (NEXIUM ) 40 MG capsule Take 1 capsule (40 mg total) by mouth daily. 90 capsule 3   glucose blood (ONETOUCH ULTRA) test strip Use once daily to check blood sugar 100 each 5   ibuprofen  (ADVIL ) 800 MG tablet Take 1 tablet (800 mg total) by mouth every 6 (six) hours as needed. 12 tablet 0   Accu-Chek Softclix Lancets lancets Use as directed once daily to check blood sugar.  100 each 12   levocetirizine (XYZAL ) 5 MG tablet Take 1 tablet (5 mg total) by mouth every evening. 90 tablet 2   losartan  (COZAAR ) 100 MG tablet Take 1 tablet (100 mg total) by mouth daily. 90 tablet 3   metFORMIN  (GLUCOPHAGE ) 1000 MG tablet Take 1 tablet (1,000 mg total) by mouth 2 (two) times daily with a meal. 180 tablet 0   metroNIDAZOLE  (METROGEL ) 0.75 % gel Apply 1 Application topically 2 (two) times daily. 45 g 2   triamcinolone  cream (KENALOG ) 0.1 % Apply 1 Application topically 2 (two) times daily. 60 g 0    Allergies No Known Allergies  Review of Systems: Constitutional:  no fevers Eye:  no recent significant change in vision Ears:  No changes in hearing Nose/Mouth/Throat:  no complaints of nasal congestion, no sore throat Cardiovascular: no chest pain Respiratory:  No shortness of breath Gastrointestinal:  No change in bowel habits GU:  Female: negative for dysuria Integumentary:  no abnormal skin lesions reported Neurologic:  no headaches Endocrine:  denies unexplained weight changes  Exam BP 122/72 (BP Location: Left Arm, Patient Position: Sitting)   Pulse 68   Temp (!) 97.5 F (36.4 C) (Oral)   Resp 16   Ht 5' 2 (1.575 m)   Wt 111 lb 6.4 oz (50.5 kg)  SpO2 96%   BMI 20.38 kg/m  General:  well developed, well nourished, in no apparent distress Skin:  no significant moles, warts, or growths Head:  no masses, lesions, or tenderness Eyes:  pupils equal and round, sclera anicteric without injection Ears:  100% obstruction w cerumen on R, no lesions on L canal, TM shiny without retraction, no obvious effusion, no erythema Nose:  nares patent, mucosa normal, and no drainage Throat/Pharynx:  lips and gingiva without lesion; tongue and uvula midline; non-inflamed pharynx; no exudates or postnasal drainage Neck: neck supple without adenopathy, thyromegaly, or masses Lungs:  clear to auscultation, breath sounds equal bilaterally, no respiratory distress Cardio:   regular rate and rhythm, no bruits or LE edema Abdomen:  abdomen soft, nontender; bowel sounds normal; no masses or organomegaly Genital: Deferred Neuro:  gait normal; deep tendon reflexes normal and symmetric Psych: well oriented with normal range of affect and appropriate judgment/insight  Assessment and Plan  Well adult exam  Type 2 diabetes mellitus with hyperglycemia, without long-term current use of insulin (HCC) - Plan: CANCELED: Hemoglobin A1c  Essential hypertension  Impacted cerumen of right ear   Well 78 y.o. female. Counseled on diet and exercise. Advanced directive form provided today.  Other orders as above. Flu shot today.  Cerumen impaction: Flush today. Debrox + home management instructions placed on paperwork.  Follow up in 3 mo to f/u on sugars. The patient thru the interpreter voiced understanding and agreement to the plan.  [name], DO 02/22/24 11:07 AM

## 2024-03-28 ENCOUNTER — Other Ambulatory Visit: Payer: Self-pay

## 2024-03-28 ENCOUNTER — Other Ambulatory Visit (HOSPITAL_BASED_OUTPATIENT_CLINIC_OR_DEPARTMENT_OTHER): Payer: Self-pay

## 2024-03-28 ENCOUNTER — Other Ambulatory Visit: Payer: Self-pay | Admitting: Family Medicine

## 2024-03-28 DIAGNOSIS — G8929 Other chronic pain: Secondary | ICD-10-CM

## 2024-03-28 MED ORDER — CELECOXIB 100 MG PO CAPS
100.0000 mg | ORAL_CAPSULE | Freq: Two times a day (BID) | ORAL | 0 refills | Status: DC
  Filled 2024-03-28: qty 60, 30d supply, fill #0

## 2024-04-01 ENCOUNTER — Ambulatory Visit: Admitting: Family Medicine

## 2024-04-01 ENCOUNTER — Encounter: Payer: Self-pay | Admitting: Family Medicine

## 2024-04-01 VITALS — BP 124/70 | HR 74 | Temp 98.0°F | Resp 16 | Ht 62.0 in | Wt 113.8 lb

## 2024-04-01 DIAGNOSIS — M25511 Pain in right shoulder: Secondary | ICD-10-CM

## 2024-04-01 MED ORDER — METHYLPREDNISOLONE ACETATE 80 MG/ML IJ SUSP
80.0000 mg | Freq: Once | INTRAMUSCULAR | Status: AC
  Administered 2024-04-01: 80 mg via INTRAMUSCULAR

## 2024-04-01 NOTE — Addendum Note (Signed)
 Addended by: Sophina Mitten M on: 04/01/2024 03:43 PM   Modules accepted: Orders

## 2024-04-01 NOTE — Patient Instructions (Addendum)
Heat (pad or rice pillow in microwave) over affected area, 10-15 minutes twice daily.   Ice/cold pack over area for 10-15 min twice daily.  OK to take Tylenol 1000 mg (2 extra strength tabs) or 975 mg (3 regular strength tabs) every 6 hours as needed.  Let us know if you need anything.  EXERCISES  RANGE OF MOTION (ROM) AND STRETCHING EXERCISES These exercises may help you when beginning to rehabilitate your injury. While completing these exercises, remember:  Restoring tissue flexibility helps normal motion to return to the joints. This allows healthier, less painful movement and activity. An effective stretch should be held for at least 30 seconds. A stretch should never be painful. You should only feel a gentle lengthening or release in the stretched tissue.  ROM - Pendulum Bend at the waist so that your right / left arm falls away from your body. Support yourself with your opposite hand on a solid surface, such as a table or a countertop. Your right / left arm should be perpendicular to the ground. If it is not perpendicular, you need to lean over farther. Relax the muscles in your right / left arm and shoulder as much as possible. Gently sway your hips and trunk so they move your right / left arm without any use of your right / left shoulder muscles. Progress your movements so that your right / left arm moves side to side, then forward and backward, and finally, both clockwise and counterclockwise. Complete 10-15 repetitions in each direction. Many people use this exercise to relieve discomfort in their shoulder as well as to gain range of motion. Repeat 2 times. Complete this exercise 3 times per week.  STRETCH - Flexion, Standing Stand with good posture. With an underhand grip on your right / left hand and an overhand grip on the opposite hand, grasp a broomstick or cane so that your hands are a little more than shoulder-width apart. Keeping your right / left elbow straight and  shoulder muscles relaxed, push the stick with your opposite hand to raise your right / left arm in front of your body and then overhead. Raise your arm until you feel a stretch in your right / left shoulder, but before you have increased shoulder pain. Try to avoid shrugging your right / left shoulder as your arm rises by keeping your shoulder blade tucked down and toward your mid-back spine. Hold 30 seconds. Slowly return to the starting position. Repeat 2 times. Complete this exercise 3 times per week.  STRETCH - Internal Rotation Place your right / left hand behind your back, palm-up. Throw a towel or belt over your opposite shoulder. Grasp the towel/belt with your right / left hand. While keeping an upright posture, gently pull up on the towel/belt until you feel a stretch in the front of your right / left shoulder. Avoid shrugging your right / left shoulder as your arm rises by keeping your shoulder blade tucked down and toward your mid-back spine. Hold 30. Release the stretch by lowering your opposite hand. Repeat 2 times. Complete this exercise 3 times per week.  STRETCH - External Rotation and Abduction Stagger your stance through a doorframe. It does not matter which foot is forward. As instructed by your physician, physical therapist or athletic trainer, place your hands: And forearms above your head and on the door frame. And forearms at head-height and on the door frame. At elbow-height and on the door frame. Keeping your head and chest upright and your   stomach muscles tight to prevent over-extending your low-back, slowly shift your weight onto your front foot until you feel a stretch across your chest and/or in the front of your shoulders. Hold 30 seconds. Shift your weight to your back foot to release the stretch. Repeat 2 times. Complete this stretch 3 times per week.   STRENGTHENING EXERCISES  These exercises may help you when beginning to rehabilitate your injury. They may  resolve your symptoms with or without further involvement from your physician, physical therapist or athletic trainer. While completing these exercises, remember:  Muscles can gain both the endurance and the strength needed for everyday activities through controlled exercises. Complete these exercises as instructed by your physician, physical therapist or athletic trainer. Progress the resistance and repetitions only as guided. You may experience muscle soreness or fatigue, but the pain or discomfort you are trying to eliminate should never worsen during these exercises. If this pain does worsen, stop and make certain you are following the directions exactly. If the pain is still present after adjustments, discontinue the exercise until you can discuss the trouble with your clinician. If advised by your physician, during your recovery, avoid activity or exercises which involve actions that place your right / left hand or elbow above your head or behind your back or head. These positions stress the tissues which are trying to heal.  STRENGTH - Scapular Depression and Adduction With good posture, sit on a firm chair. Supported your arms in front of you with pillows, arm rests or a table top. Have your elbows in line with the sides of your body. Gently draw your shoulder blades down and toward your mid-back spine. Gradually increase the tension without tensing the muscles along the top of your shoulders and the back of your neck. Hold for 3 seconds. Slowly release the tension and relax your muscles completely before completing the next repetition. After you have practiced this exercise, remove the arm support and complete it in standing as well as sitting. Repeat 2 times. Complete this exercise 3 times per week.   STRENGTH - External Rotators Secure a rubber exercise band/tubing to a fixed object so that it is at the same height as your right / left elbow when you are standing or sitting on a firm  surface. Stand or sit so that the secured exercise band/tubing is at your side that is not injured. Bend your elbow 90 degrees. Place a folded towel or small pillow under your right / left arm so that your elbow is a few inches away from your side. Keeping the tension on the exercise band/tubing, pull it away from your body, as if pivoting on your elbow. Be sure to keep your body steady so that the movement is only coming from your shoulder rotating. Hold 3 seconds. Release the tension in a controlled manner as you return to the starting position. Repeat 2 times. Complete this exercise 3 times per week.   STRENGTH - Supraspinatus Stand or sit with good posture. Grasp a 2-3 lb weight or an exercise band/tubing so that your hand is "thumbs-up," like when you shake hands. Slowly lift your right / left hand from your thigh into the air, traveling about 30 degrees from straight out at your side. Lift your hand to shoulder height or as far as you can without increasing any shoulder pain. Initially, many people do not lift their hands above shoulder height. Avoid shrugging your right / left shoulder as your arm rises by keeping your   shoulder blade tucked down and toward your mid-back spine. Hold for 3 seconds. Control the descent of your hand as you slowly return to your starting position. Repeat 2 times. Complete this exercise 3 times per week.   STRENGTH - Shoulder Extensors Secure a rubber exercise band/tubing so that it is at the height of your shoulders when you are either standing or sitting on a firm arm-less chair. With a thumbs-up grip, grasp an end of the band/tubing in each hand. Straighten your elbows and lift your hands straight in front of you at shoulder height. Step back away from the secured end of band/tubing until it becomes tense. Squeezing your shoulder blades together, pull your hands down to the sides of your thighs. Do not allow your hands to go behind you. Hold for 3 seconds.  Slowly ease the tension on the band/tubing as you reverse the directions and return to the starting position. Repeat 2 times. Complete this exercise 3 times per week.   STRENGTH - Scapular Retractors Secure a rubber exercise band/tubing so that it is at the height of your shoulders when you are either standing or sitting on a firm arm-less chair. With a palm-down grip, grasp an end of the band/tubing in each hand. Straighten your elbows and lift your hands straight in front of you at shoulder height. Step back away from the secured end of band/tubing until it becomes tense. Squeezing your shoulder blades together, draw your elbows back as you bend them. Keep your upper arm lifted away from your body throughout the exercise. Hold 3 seconds. Slowly ease the tension on the band/tubing as you reverse the directions and return to the starting position. Repeat 2 times. Complete this exercise 3 times per week.  STRENGTH - Scapular Depressors Find a sturdy chair without wheels, such as a from a dining room table. Keeping your feet on the floor, lift your bottom from the seat and lock your elbows. Keeping your elbows straight, allow gravity to pull your body weight down. Your shoulders will rise toward your ears. Raise your body against gravity by drawing your shoulder blades down your back, shortening the distance between your shoulders and ears. Although your feet should always maintain contact with the floor, your feet should progressively support less body weight as you get stronger. Hold 3 seconds. In a controlled and slow manner, lower your body weight to begin the next repetition. Repeat 2 times. Complete this exercise 3 times per week.    This information is not intended to replace advice given to you by your health care provider. Make sure you discuss any questions you have with your health care provider.   Document Released: 02/26/2005 Document Revised: 05/05/2014 Document Reviewed:  07/27/2008 [employer] Interactive Patient Education 2016 [employer]  

## 2024-04-01 NOTE — Progress Notes (Addendum)
 Musculoskeletal Exam  Patient: Dawn Schwartz DOB: 06/28/1945  DOS: 04/01/2024  SUBJECTIVE:  Chief Complaint:   Chief Complaint  Patient presents with   Shoulder Pain    Shoulder Pain    Dawn Schwartz is a 78 y.o.  female for evaluation and treatment of R shoulder pain. Here w aid of Korean video interpreter.   Onset:  3 weeks ago. No inj or change in activity.  Location: trap and lateral shoulder Character:  aching  Progression of issue:  is unchanged Associated symptoms: decreased ROM No bruising, redness, swelling Treatment: to date has been ice, home exercises, and massage.   Neurovascular symptoms: no  Past Medical History:  Diagnosis Date   Aortic atherosclerosis    Arthritis    Diabetes mellitus type 2 in nonobese [facility])    Essential hypertension    History of Helicobacter pylori infection    Hypercholesterolemia    Weight loss     Objective: VITAL SIGNS: BP 124/70 (BP Location: Left Arm, Patient Position: Sitting)   Pulse 74   Temp 98 F (36.7 C) (Oral)   Resp 16   Ht 5' 2 (1.575 m)   Wt 113 lb 12.8 oz (51.6 kg)   SpO2 97%   BMI 20.81 kg/m  Constitutional: Well formed, well developed. No acute distress. Thorax & Lungs: No accessory muscle use Musculoskeletal: R shoulder .   Normal active range of motion: no.   Normal passive range of motion: no Tenderness to palpation: yes posterior delt Deformity: no Ecchymosis: no Tests positive: none Tests negative: Neer's, cross over, Hawkins, empty can equivocal, speed's Neurologic: Normal sensory function.  Psychiatric: Normal mood. Age appropriate judgment and insight. Alert & oriented x 3.    Assessment:  Acute pain of right shoulder - Plan: methylPREDNISolone  acetate (DEPO-MEDROL ) injection 80 mg  Plan: Stretches/exercises, heat, ice, Tylenol. Pt if no better in the next 3-4 weeks. Suspect posterior deltoid strain. IM Depo 80 mg today.  F/u prn. The patient, thru the interpreter, voiced understanding and  agreement to the plan.   Mabel [place], DO 04/06/24  6:57 AM

## 2024-05-03 ENCOUNTER — Other Ambulatory Visit (HOSPITAL_BASED_OUTPATIENT_CLINIC_OR_DEPARTMENT_OTHER): Payer: Self-pay

## 2024-05-03 ENCOUNTER — Other Ambulatory Visit: Payer: Self-pay

## 2024-05-03 ENCOUNTER — Other Ambulatory Visit: Payer: Self-pay | Admitting: Family Medicine

## 2024-05-03 ENCOUNTER — Telehealth: Payer: Self-pay | Admitting: Family Medicine

## 2024-05-03 MED ORDER — METFORMIN HCL 1000 MG PO TABS
1000.0000 mg | ORAL_TABLET | Freq: Two times a day (BID) | ORAL | 0 refills | Status: AC
  Filled 2024-05-03: qty 180, 90d supply, fill #0

## 2024-05-03 NOTE — Telephone Encounter (Signed)
 Patient is scheduled for labs but needs an order

## 2024-05-03 NOTE — Telephone Encounter (Signed)
 A1c. Please ensure the date of the draw is at least 90 d after her most recent. Thx.

## 2024-05-09 ENCOUNTER — Telehealth: Payer: Self-pay | Admitting: Family Medicine

## 2024-05-09 NOTE — Telephone Encounter (Signed)
 Copied from CRM [id number]. Topic: Medicare AWV >> May 09, 2024 10:34 AM Nathanel DEL wrote: Called LVM 05/09/2024 to sched AWVI. Please schedule AWVI in office.   Nathanel Paschal; Care Guide Ambulatory Clinical Support [place] [facility] Direct Dial: [phone]

## 2024-05-10 ENCOUNTER — Telehealth: Payer: Self-pay | Admitting: Family Medicine

## 2024-05-10 NOTE — Telephone Encounter (Signed)
 Patient scheduled for lab appointment and needs lab orders

## 2024-05-16 ENCOUNTER — Other Ambulatory Visit

## 2024-05-23 ENCOUNTER — Ambulatory Visit: Admitting: Family Medicine

## 2024-05-27 ENCOUNTER — Other Ambulatory Visit: Payer: Self-pay | Admitting: Family Medicine

## 2024-05-27 ENCOUNTER — Ambulatory Visit: Admitting: Family Medicine

## 2024-05-27 ENCOUNTER — Encounter: Payer: Self-pay | Admitting: Family Medicine

## 2024-05-27 ENCOUNTER — Other Ambulatory Visit (HOSPITAL_BASED_OUTPATIENT_CLINIC_OR_DEPARTMENT_OTHER): Payer: Self-pay

## 2024-05-27 VITALS — BP 122/72 | HR 75 | Temp 98.0°F | Resp 16 | Ht 62.0 in | Wt 111.0 lb

## 2024-05-27 DIAGNOSIS — E1165 Type 2 diabetes mellitus with hyperglycemia: Secondary | ICD-10-CM | POA: Diagnosis not present

## 2024-05-27 DIAGNOSIS — Z7984 Long term (current) use of oral hypoglycemic drugs: Secondary | ICD-10-CM | POA: Diagnosis not present

## 2024-05-27 DIAGNOSIS — G8929 Other chronic pain: Secondary | ICD-10-CM

## 2024-05-27 MED ORDER — DAPAGLIFLOZIN PROPANEDIOL 10 MG PO TABS
10.0000 mg | ORAL_TABLET | Freq: Every day | ORAL | 3 refills | Status: AC
  Filled 2024-05-27: qty 30, 30d supply, fill #0

## 2024-05-27 MED ORDER — CELECOXIB 100 MG PO CAPS
100.0000 mg | ORAL_CAPSULE | Freq: Two times a day (BID) | ORAL | 0 refills | Status: AC
  Filled 2024-05-27 (×2): qty 60, 30d supply, fill #0

## 2024-05-27 NOTE — Addendum Note (Signed)
 Addended by: TRUDY CURVIN RAMAN on: 05/27/2024 01:56 PM   Modules accepted: Orders

## 2024-05-27 NOTE — Progress Notes (Signed)
 Subjective:   Chief Complaint  Patient presents with   Follow-up    Follow Up    Dawn Schwartz is a 79 y.o. female here for follow-up of diabetes.  She is here w the aid of a Korean video interpreter.  Meggen's self monitored glucose range is mid 100s-300's.  Patient denies hypoglycemic reactions. She checks her glucose levels 1 time(s) per day. Patient does not require insulin.   Medications include: metformin  1000 mg bid Diet is OK.  Exercise: stretching, active in yard/house  Past Medical History:  Diagnosis Date   Aortic atherosclerosis    Arthritis    Diabetes mellitus type 2 in nonobese [facility])    Essential hypertension    History of Helicobacter pylori infection    Hypercholesterolemia    Weight loss      Related testing: Retinal exam: Done Pneumovax: done  Objective:  BP 122/72 (BP Location: Left Arm, Patient Position: Sitting)   Pulse 75   Temp 98 F (36.7 C) (Oral)   Resp 16   Ht 5' 2 (1.575 m)   Wt 111 lb (50.3 kg)   SpO2 98%   BMI 20.30 kg/m  General:  Well developed, well nourished, in no apparent distress Lungs:  CTAB, no access msc use Cardio:  RRR, no bruits, no LE edema Psych: Age appropriate judgment and insight  Assessment:   Type 2 diabetes mellitus with hyperglycemia, without long-term current use of insulin (HCC) - Plan: Hemoglobin A1c, dapagliflozin  propanediol (FARXIGA ) 10 MG TABS tablet   Plan:   Chronic, not controlled. Ck above. Based on home readings, will not be controlled. Cont metformin  1000 mg bid. Add Farxiga  10 mg/d- let us  know if there are cost issues. Counseled on diet and exercise. F/u in 3 mo. The patient thru the interpreter voiced understanding and agreement to the plan.  Mabel [place], DO 05/27/24 1:42 PM

## 2024-05-27 NOTE — Patient Instructions (Addendum)
 Keep the diet clean and stay active.  Give us  2-3 business days to get the results of your labs back.   Continue monitoring blood sugars at home.  Please let me know if there are cost issues with this new medication.   Let us  know if you need anything.

## 2024-05-28 ENCOUNTER — Ambulatory Visit: Payer: Self-pay | Admitting: Family Medicine

## 2024-05-28 LAB — HEMOGLOBIN A1C
Hgb A1c MFr Bld: 6.9 % — ABNORMAL HIGH
Mean Plasma Glucose: 151 mg/dL
eAG (mmol/L): 8.4 mmol/L

## 2024-08-01 ENCOUNTER — Ambulatory Visit: Admitting: Family Medicine

## 2024-08-29 ENCOUNTER — Ambulatory Visit: Admitting: Family Medicine
